# Patient Record
Sex: Male | Born: 1950
Health system: Southern US, Community
[De-identification: ages and names within clinical notes are randomized; demographics above are authoritative.]

## PROBLEM LIST (undated history)

## (undated) DIAGNOSIS — T7840XA Allergy, unspecified, initial encounter: Secondary | ICD-10-CM

## (undated) DIAGNOSIS — R7303 Prediabetes: Secondary | ICD-10-CM

## (undated) DIAGNOSIS — D72819 Decreased white blood cell count, unspecified: Secondary | ICD-10-CM

## (undated) DIAGNOSIS — R001 Bradycardia, unspecified: Secondary | ICD-10-CM

## (undated) DIAGNOSIS — Z789 Other specified health status: Secondary | ICD-10-CM

## (undated) DIAGNOSIS — I639 Cerebral infarction, unspecified: Secondary | ICD-10-CM

## (undated) DIAGNOSIS — J069 Acute upper respiratory infection, unspecified: Secondary | ICD-10-CM

## (undated) DIAGNOSIS — G43909 Migraine, unspecified, not intractable, without status migrainosus: Secondary | ICD-10-CM

## (undated) HISTORY — DX: Other specified health status: Z78.9

## (undated) HISTORY — PX: SHOULDER SURGERY: SHX246

## (undated) HISTORY — DX: Allergy, unspecified, initial encounter: T78.40XA

## (undated) HISTORY — DX: Acute upper respiratory infection, unspecified: J06.9

## (undated) HISTORY — DX: Bradycardia, unspecified: R00.1

## (undated) HISTORY — DX: Migraine, unspecified, not intractable, without status migrainosus: G43.909

## (undated) HISTORY — DX: Cerebral infarction, unspecified: I63.9

## (undated) HISTORY — DX: Decreased white blood cell count, unspecified: D72.819

## (undated) HISTORY — DX: Prediabetes: R73.03

---

## 2001-10-25 ENCOUNTER — Encounter: Payer: Self-pay | Admitting: Family Medicine

## 2001-10-25 ENCOUNTER — Encounter: Admission: RE | Admit: 2001-10-25 | Discharge: 2001-10-25 | Payer: Self-pay | Admitting: Family Medicine

## 2003-05-09 ENCOUNTER — Encounter: Admission: RE | Admit: 2003-05-09 | Discharge: 2003-05-09 | Payer: Self-pay | Admitting: Family Medicine

## 2003-12-28 ENCOUNTER — Ambulatory Visit (HOSPITAL_COMMUNITY): Admission: RE | Admit: 2003-12-28 | Discharge: 2003-12-28 | Payer: Self-pay | Admitting: Gastroenterology

## 2006-03-04 ENCOUNTER — Emergency Department (HOSPITAL_COMMUNITY): Admission: EM | Admit: 2006-03-04 | Discharge: 2006-03-04 | Payer: Self-pay | Admitting: Emergency Medicine

## 2006-06-15 ENCOUNTER — Ambulatory Visit: Payer: Self-pay | Admitting: Oncology

## 2006-07-22 LAB — CBC WITH DIFFERENTIAL/PLATELET
Basophils Absolute: 0 10*3/uL (ref 0.0–0.1)
EOS%: 2.2 % (ref 0.0–7.0)
HCT: 36.8 % — ABNORMAL LOW (ref 38.7–49.9)
HGB: 12.7 g/dL — ABNORMAL LOW (ref 13.0–17.1)
MCH: 30.8 pg (ref 28.0–33.4)
MCHC: 34.5 g/dL (ref 32.0–35.9)
MCV: 89.1 fL (ref 81.6–98.0)
MONO%: 9.7 % (ref 0.0–13.0)
NEUT%: 37.6 % — ABNORMAL LOW (ref 40.0–75.0)
RDW: 12.2 % (ref 11.2–14.6)

## 2006-07-23 LAB — RHEUMATOID FACTOR: Rhuematoid fact SerPl-aCnc: <20 IU/mL (ref 0–20)

## 2006-07-23 LAB — COMPREHENSIVE METABOLIC PANEL
Alkaline Phosphatase: 67 U/L (ref 39–117)
BUN: 21 mg/dL (ref 6–23)
Creatinine, Ser: 1.27 mg/dL (ref 0.40–1.50)
Glucose, Bld: 89 mg/dL (ref 70–99)
Total Bilirubin: 0.6 mg/dL (ref 0.3–1.2)

## 2006-07-23 LAB — ANA: Anti Nuclear Antibody(ANA): NEGATIVE

## 2010-03-08 ENCOUNTER — Encounter: Payer: Self-pay | Admitting: Family Medicine

## 2010-07-09 ENCOUNTER — Other Ambulatory Visit: Payer: Self-pay | Admitting: Family Medicine

## 2010-07-10 ENCOUNTER — Ambulatory Visit
Admission: RE | Admit: 2010-07-10 | Discharge: 2010-07-10 | Disposition: A | Discharge status: Home / Self Care | Payer: BC Managed Care – PPO | Source: Ambulatory Visit | Attending: Family Medicine | Admitting: Family Medicine

## 2010-08-05 ENCOUNTER — Encounter: Payer: Self-pay | Admitting: Family Medicine

## 2010-08-05 DIAGNOSIS — D72819 Decreased white blood cell count, unspecified: Secondary | ICD-10-CM | POA: Insufficient documentation

## 2010-08-05 DIAGNOSIS — G43909 Migraine, unspecified, not intractable, without status migrainosus: Secondary | ICD-10-CM | POA: Insufficient documentation

## 2012-01-10 ENCOUNTER — Emergency Department (HOSPITAL_COMMUNITY)
Admission: EM | Admit: 2012-01-10 | Discharge: 2012-01-10 | Discharge status: Against Medical Advice | Payer: BC Managed Care – PPO | Attending: Emergency Medicine | Admitting: Emergency Medicine

## 2012-01-10 ENCOUNTER — Encounter (HOSPITAL_COMMUNITY): Payer: Self-pay | Admitting: *Deleted

## 2012-01-10 DIAGNOSIS — Y9389 Activity, other specified: Secondary | ICD-10-CM | POA: Insufficient documentation

## 2012-01-10 DIAGNOSIS — Y92410 Unspecified street and highway as the place of occurrence of the external cause: Secondary | ICD-10-CM | POA: Insufficient documentation

## 2012-01-10 DIAGNOSIS — R51 Headache: Secondary | ICD-10-CM | POA: Insufficient documentation

## 2012-01-10 DIAGNOSIS — IMO0002 Reserved for concepts with insufficient information to code with codable children: Secondary | ICD-10-CM | POA: Insufficient documentation
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
## 2012-01-10 NOTE — ED Notes (Signed)
Patient to room T9 - Patient went to Peds for family member.  Instructed to return to room when he can be seen by MD

## 2012-01-10 NOTE — ED Notes (Signed)
Pt has to go to OR with niece and states he will come back later for evaluation

## 2012-05-07 ENCOUNTER — Emergency Department (HOSPITAL_COMMUNITY)
Admission: EM | Admit: 2012-05-07 | Discharge: 2012-05-07 | Disposition: A | Discharge status: Home / Self Care | Payer: BC Managed Care – PPO | Source: Home / Self Care | Attending: Family Medicine | Admitting: Family Medicine

## 2012-05-07 ENCOUNTER — Encounter (HOSPITAL_COMMUNITY): Payer: Self-pay | Admitting: Emergency Medicine

## 2012-05-07 DIAGNOSIS — K529 Noninfective gastroenteritis and colitis, unspecified: Secondary | ICD-10-CM

## 2012-05-07 DIAGNOSIS — K5289 Other specified noninfective gastroenteritis and colitis: Secondary | ICD-10-CM

## 2012-05-07 MED ORDER — ONDANSETRON HCL 4 MG PO TABS: 4.0000 mg | ORAL_TABLET | Freq: Four times a day (QID) | ORAL | Status: DC

## 2012-05-07 NOTE — ED Notes (Signed)
[id number]<AV> Expected date:05/07/12<BR> Expected time:12:48 PM<BR> Means of arrival:<BR> Comments:<BR> Sprayed room with nix

## 2012-05-07 NOTE — ED Provider Notes (Signed)
History     CSN: [id number]  Arrival date & time 05/07/12  1153   First MD Initiated Contact with Patient 05/07/12 1229      Chief Complaint  Patient presents with  . URI    (Consider location/radiation/quality/duration/timing/severity/associated sxs/prior treatment) Patient is a 62 y.o. male presenting with vomiting. The history is provided by the patient.  Emesis Severity:  Mild Duration:  12 hours Quality:  Stomach contents Able to tolerate:  Liquids Progression:  Resolved Chronicity:  New Recent urination:  Normal Associated symptoms: diarrhea   Associated symptoms: no abdominal pain and no fever   Risk factors: no sick contacts     Past Medical History  Diagnosis Date  . Migraine headache   . Allergy   . Leukopenia     Past Surgical History  Procedure Laterality Date  . Shoulder surgery      right    History reviewed. No pertinent family history.  History  Substance Use Topics  . Smoking status: Never Smoker   . Smokeless tobacco: Not on file  . Alcohol Use: No      Review of Systems  Constitutional: Negative.   HENT: Negative.   Gastrointestinal: Positive for nausea, vomiting and diarrhea. Negative for abdominal pain, anal bleeding and rectal pain.    Allergies  Codeine  Home Medications   Current Outpatient Rx  Name  Route  Sig  Dispense  Refill  . ondansetron (ZOFRAN) 4 MG tablet   Oral   Take 1 tablet (4 mg total) by mouth every 6 (six) hours. Prn n/v   6 tablet   0     BP 120/81  Pulse 51  Temp(Src) 97.8 F (36.6 C) (Oral)  SpO2 100%  Physical Exam  Nursing note and vitals reviewed. Constitutional: He is oriented to person, place, and time. He appears well-developed and well-nourished. No distress.  Abdominal: Soft. Bowel sounds are normal. He exhibits no distension and no mass. There is no tenderness. There is no rebound and no guarding.  Neurological: He is alert and oriented to person, place, and time.  Skin: Skin is  warm and dry.    ED Course  Procedures (including critical care time)  Labs Reviewed - No data to display No results found.   1. Gastroenteritis, acute       MDM          Linna Hoff, MD 05/07/12 1344

## 2012-05-07 NOTE — ED Notes (Signed)
No answer in waiting roomx1

## 2012-05-07 NOTE — ED Notes (Addendum)
Pt c/o nausea, vomitting, and diarrhea last night. Episode lasted about one hour then subsided. Loose, watery, stools. Patient denies bloody stools. No fever. Pt did experience night sweats. Has acute flank pain. Patient is alert and oriented.

## 2012-07-22 ENCOUNTER — Ambulatory Visit: Payer: Self-pay | Admitting: Family Medicine

## 2012-07-29 ENCOUNTER — Ambulatory Visit (INDEPENDENT_AMBULATORY_CARE_PROVIDER_SITE_OTHER): Payer: BC Managed Care – PPO | Admitting: *Deleted

## 2012-07-29 DIAGNOSIS — Z2911 Encounter for prophylactic immunotherapy for respiratory syncytial virus (RSV): Secondary | ICD-10-CM

## 2012-07-29 DIAGNOSIS — Z23 Encounter for immunization: Secondary | ICD-10-CM

## 2012-11-08 ENCOUNTER — Encounter: Payer: Self-pay | Admitting: Family Medicine

## 2012-11-08 ENCOUNTER — Ambulatory Visit (INDEPENDENT_AMBULATORY_CARE_PROVIDER_SITE_OTHER): Payer: BC Managed Care – PPO | Admitting: Family Medicine

## 2012-11-08 VITALS — BP 138/78 | HR 80 | Temp 97.5°F | Resp 16 | Ht 68.0 in | Wt 183.0 lb

## 2012-11-08 DIAGNOSIS — J069 Acute upper respiratory infection, unspecified: Secondary | ICD-10-CM

## 2012-11-08 HISTORY — DX: Acute upper respiratory infection, unspecified: J06.9

## 2012-11-08 MED ORDER — AZITHROMYCIN 250 MG PO TABS: ORAL_TABLET | ORAL | Status: DC

## 2012-11-08 NOTE — Patient Instructions (Signed)
Take antibiotics as prescribed Pick up mucinex DM- take 1 tablet twice a day  Plenty of fluids

## 2012-11-08 NOTE — Assessment & Plan Note (Addendum)
Worsening URI, no fever currently, no other co-morbidites Normal WOB in office. Cough with production worse symptom Mucinex DM, z pak given to cover as worsening

## 2012-11-08 NOTE — Progress Notes (Signed)
  Subjective:    Patient ID: Cody [name], male    DOB: 1950-11-16, 62 y.o.   MRN: [id number]  HPI Pt here with worsening cough with some production, fever, and headache for the past 5 days. Has taken OTC alkaseltzer with some improvement in symptoms. One day felt a little SOB, denies CP, N/V, diaphoresis. No current medications.    Review of Systems - per above  GEN- + fatigue,+ fever, weight loss,weakness, recent illness HEENT- denies eye drainage, change in vision, nasal discharge, CVS- denies chest pain, palpitations RESP- denies SOB, +cough, wheeze MSK- denies joint pain, muscle aches, injury Neuro- + headache, dizziness, syncope, seizure activity       Objective:   Physical Exam GEN- NAD, alert and oriented x3 HEENT- PERRL, EOMI, non injected sclera, pink conjunctiva, MMM, oropharynx mild injection, TM clear bilat no effusion, no maxillary sinus tenderness, nares clear Neck- Supple, shotty LAD CVS- RRR, no murmur RESP-CTAB, upper airway congestion EXT- No edema Pulses- Radial 2+         Assessment & Plan:

## 2012-12-23 LAB — HM COLONOSCOPY: HM COLON: NORMAL

## 2013-02-27 ENCOUNTER — Encounter: Payer: Self-pay | Admitting: Family Medicine

## 2013-02-27 ENCOUNTER — Ambulatory Visit (INDEPENDENT_AMBULATORY_CARE_PROVIDER_SITE_OTHER): Payer: BC Managed Care – PPO | Admitting: Family Medicine

## 2013-02-27 VITALS — BP 116/86 | HR 52 | Temp 97.6°F | Resp 18 | Wt 183.0 lb

## 2013-02-27 DIAGNOSIS — R55 Syncope and collapse: Secondary | ICD-10-CM

## 2013-02-27 DIAGNOSIS — R002 Palpitations: Secondary | ICD-10-CM

## 2013-02-27 NOTE — Progress Notes (Signed)
Subjective:    Patient ID: Cody [name], male    DOB: November 10, 1950, 63 y.o.   MRN: [id number]  HPI  Patient is a very pleasant 63 year old African American male who presents with one-week history of "heart racing."  Prior to one week ago, the patient was doing well with no complaints. Over the last week he has had 3 different episodes were his heart will start to race. He states it lasts seconds to minutes. He feels several palpitations in his chest.  It would then spontaneously stopped. He is down so bad however he became dizzy and somewhat lightheaded. He denies any loss of consciousness. He denies any chest pain or shortness of breath or dyspnea on exertion. He denies any excessive caffeine consumption. He denies any stimulant medication. He denies any illicit drug use. He has no family history of thyroid problems, cardiac arrhythmias, or premature sudden cardiac death. Past Medical History  Diagnosis Date  . Migraine headache   . Allergy   . Leukopenia   . Bradycardia    No current outpatient prescriptions on file prior to visit.   No current facility-administered medications on file prior to visit.   Allergies  Allergen Reactions  . Codeine     Sick feeling   History   Social History  . Marital Status: Married    Spouse Name: N/A    Number of Children: N/A  . Years of Education: N/A   Occupational History  . Not on file.   Social History Main Topics  . Smoking status: Never Smoker   . Smokeless tobacco: Never Used  . Alcohol Use: No  . Drug Use: No  . Sexual Activity: Not on file   Other Topics Concern  . Not on file   Social History Narrative  . No narrative on file     Review of Systems  All other systems reviewed and are negative.       Objective:   Physical Exam  Vitals reviewed. Constitutional: He is oriented to person, place, and time. He appears well-developed and well-nourished.  Eyes: Conjunctivae and EOM are normal. Pupils are equal,  round, and reactive to light. No scleral icterus.  Neck: Neck supple. No JVD present. No thyromegaly present.  Cardiovascular: Normal rate, regular rhythm and normal heart sounds.  Exam reveals no gallop and no friction rub.   No murmur heard. Pulmonary/Chest: Effort normal and breath sounds normal. No respiratory distress. He has no wheezes. He has no rales. He exhibits no tenderness.  Abdominal: Soft. Bowel sounds are normal. He exhibits no distension. There is no tenderness. There is no rebound and no guarding.  Lymphadenopathy:    He has no cervical adenopathy.  Neurological: He is alert and oriented to person, place, and time. He has normal reflexes. No cranial nerve deficit. Coordination normal.          Assessment & Plan:  1. Palpitations - CBC with Differential - COMPLETE METABOLIC PANEL WITH GFR - TSH - EKG 12-Lead - Holter monitor - 48 hour; Future  2. Near syncope - Holter monitor - 48 hour; Future   I will obtain a CBC, CMP, TSH. EKG today in the office shows sinus bradycardia 51 beats per minute with normal intervals and normal axis and no evidence of ischemia or infarction. The patient does have early repolarization on EKG.  I will also obtain a Holter monitor. My anticipation is that the patient is having PVCs and PACs. I will confirm this with a  Holter monitor. We need to rule out significant cardiac arrhythmias.

## 2013-02-28 LAB — COMPLETE METABOLIC PANEL WITH GFR
ALT: 12 U/L (ref 0–53)
AST: 15 U/L (ref 0–37)
Albumin: 4.1 g/dL (ref 3.5–5.2)
Alkaline Phosphatase: 61 U/L (ref 39–117)
BILIRUBIN TOTAL: 0.6 mg/dL (ref 0.3–1.2)
BUN: 22 mg/dL (ref 6–23)
CALCIUM: 9.2 mg/dL (ref 8.4–10.5)
CHLORIDE: 103 meq/L (ref 96–112)
CO2: 26 meq/L (ref 19–32)
CREATININE: 1.31 mg/dL (ref 0.50–1.35)
GFR, EST AFRICAN AMERICAN: 68 mL/min
GFR, Est Non African American: 59 mL/min — ABNORMAL LOW
Glucose, Bld: 76 mg/dL (ref 70–99)
Potassium: 4.5 mEq/L (ref 3.5–5.3)
Sodium: 139 mEq/L (ref 135–145)
Total Protein: 6.7 g/dL (ref 6.0–8.3)

## 2013-02-28 LAB — CBC WITH DIFFERENTIAL/PLATELET
BASOS ABS: 0 10*3/uL (ref 0.0–0.1)
Basophils Relative: 1 % (ref 0–1)
EOS ABS: 0.1 10*3/uL (ref 0.0–0.7)
EOS PCT: 2 % (ref 0–5)
HEMATOCRIT: 37.1 % — AB (ref 39.0–52.0)
Hemoglobin: 12.5 g/dL — ABNORMAL LOW (ref 13.0–17.0)
LYMPHS PCT: 61 % — AB (ref 12–46)
Lymphs Abs: 2.1 10*3/uL (ref 0.7–4.0)
MCH: 30 pg (ref 26.0–34.0)
MCHC: 33.7 g/dL (ref 30.0–36.0)
MCV: 89.2 fL (ref 78.0–100.0)
MONO ABS: 0.3 10*3/uL (ref 0.1–1.0)
Monocytes Relative: 9 % (ref 3–12)
Neutro Abs: 1 10*3/uL — ABNORMAL LOW (ref 1.7–7.7)
Neutrophils Relative %: 27 % — ABNORMAL LOW (ref 43–77)
PLATELETS: 200 10*3/uL (ref 150–400)
RBC: 4.16 MIL/uL — ABNORMAL LOW (ref 4.22–5.81)
RDW: 13.3 % (ref 11.5–15.5)
WBC: 3.4 10*3/uL — ABNORMAL LOW (ref 4.0–10.5)

## 2013-02-28 LAB — TSH: TSH: 1.01 u[IU]/mL (ref 0.350–4.500)

## 2013-03-02 ENCOUNTER — Encounter: Payer: Self-pay | Admitting: Podiatrist

## 2013-03-02 ENCOUNTER — Ambulatory Visit (INDEPENDENT_AMBULATORY_CARE_PROVIDER_SITE_OTHER): Payer: BC Managed Care – PPO | Admitting: Podiatrist

## 2013-03-02 ENCOUNTER — Other Ambulatory Visit: Payer: Self-pay | Admitting: Family Medicine

## 2013-03-02 VITALS — BP 122/70 | HR 65 | Resp 12

## 2013-03-02 DIAGNOSIS — M722 Plantar fascial fibromatosis: Secondary | ICD-10-CM

## 2013-03-02 MED ORDER — TRIAMCINOLONE ACETONIDE 40 MG/ML IJ SUSP
20.0000 mg | Freq: Once | INTRAMUSCULAR | Status: AC
  Administered 2013-03-02: 20 mg

## 2013-03-02 NOTE — Progress Notes (Signed)
Subjective:  Patient presents today for follow up on bilateral heel pain.  In the past the patient has had 2 separate injections in bilateral heels over 6 months ago-  He has done well with the injections in the past but states the foot pain has returned.  He also has pain submet 5 as well.  He already has orthotics which are helpful.  Objective:  Patient's chart is reviewed  Neurovascular status is unchanged with palpable pedal pulses and neurological sensation intact.  Plantar fasciitis symptomatology is elicited bilateral heels.  Pain submet 5 right is also present with a hyperkeratotic lesion noted.    Assessment:  Plantar fasciitis bilateral,  Callus x 1  Plan:  Injected plantar fascia bilaterally with kenalog and marcaine mix under sterile technique.  Hyperkeratotic lesion was debrided and orthotic was padded.  Patient given instructions on stretching exercises and shoe gear and will be seen back prn.

## 2013-03-02 NOTE — Patient Instructions (Signed)

## 2013-03-03 ENCOUNTER — Ambulatory Visit (INDEPENDENT_AMBULATORY_CARE_PROVIDER_SITE_OTHER): Payer: BC Managed Care – PPO | Admitting: Family Medicine

## 2013-03-03 DIAGNOSIS — R002 Palpitations: Secondary | ICD-10-CM

## 2013-03-06 NOTE — Progress Notes (Signed)
Patient ID: Cody [name], male   DOB: 1951-02-08, 63 y.o.   MRN: [id number] Pt had Holter Monitor applied on Friday 03/03/2013.  Pt returns unit today for processing.  No problems noted while wearing monitor.  No event diary returned.  Monitor was ordered by Dr Tanya NonesPickard with diagnosis of Palpitations.

## 2013-03-07 ENCOUNTER — Encounter (HOSPITAL_COMMUNITY): Payer: Self-pay | Admitting: Emergency Medicine

## 2013-03-07 ENCOUNTER — Emergency Department (HOSPITAL_COMMUNITY)
Admission: EM | Admit: 2013-03-07 | Discharge: 2013-03-07 | Disposition: A | Discharge status: Home / Self Care | Payer: BC Managed Care – PPO | Source: Home / Self Care

## 2013-03-07 DIAGNOSIS — R6889 Other general symptoms and signs: Secondary | ICD-10-CM

## 2013-03-07 MED ORDER — ONDANSETRON HCL 4 MG PO TABS: 4.0000 mg | ORAL_TABLET | Freq: Four times a day (QID) | ORAL | Status: DC

## 2013-03-07 MED ORDER — OSELTAMIVIR PHOSPHATE 75 MG PO CAPS: 75.0000 mg | ORAL_CAPSULE | Freq: Two times a day (BID) | ORAL | Status: DC

## 2013-03-07 NOTE — ED Provider Notes (Signed)
CSN: [id number]     Arrival date & time 03/07/13  1139 History   First MD Initiated Contact with Patient 03/07/13 1319     Chief Complaint  Patient presents with  . Generalized Body Aches  . Fever   (Consider location/radiation/quality/duration/timing/severity/associated sxs/prior Treatment) HPI Comments: Patient complains of flulike symptoms that started rather acutely last p.m. States he did receive a flu shot this year.   Past Medical History  Diagnosis Date  . Migraine headache   . Allergy   . Leukopenia   . Bradycardia    Past Surgical History  Procedure Laterality Date  . Shoulder surgery      right   History reviewed. No pertinent family history. History  Substance Use Topics  . Smoking status: Never Smoker   . Smokeless tobacco: Never Used  . Alcohol Use: No    Review of Systems  Constitutional: Positive for fever, chills, activity change and fatigue. Negative for diaphoresis.  HENT: Positive for congestion, postnasal drip and rhinorrhea. Negative for ear pain, facial swelling, sore throat and trouble swallowing.   Eyes: Negative for pain, discharge and redness.  Respiratory: Positive for cough. Negative for chest tightness and shortness of breath.   Cardiovascular: Negative.   Gastrointestinal: Positive for nausea. Negative for vomiting, abdominal pain, diarrhea and constipation.  Genitourinary: Negative.   Musculoskeletal: Negative.  Negative for neck pain and neck stiffness.  Neurological: Negative.     Allergies  Codeine  Home Medications   Current Outpatient Rx  Name  Route  Sig  Dispense  Refill  . ondansetron (ZOFRAN) 4 MG tablet   Oral   Take 1 tablet (4 mg total) by mouth every 6 (six) hours.   12 tablet   0   . oseltamivir (TAMIFLU) 75 MG capsule   Oral   Take 1 capsule (75 mg total) by mouth every 12 (twelve) hours.   10 capsule   0    BP 140/101  Pulse 90  Temp(Src) 98.9 F (37.2 C) (Oral)  Resp 18  SpO2 95% Physical Exam   Nursing note and vitals reviewed. Constitutional: He is oriented to person, place, and time. He appears well-developed and well-nourished. No distress.  HENT:  Mouth/Throat: No oropharyngeal exudate.  Bilateral TMs are normal Oropharynx with minor erythema and clear PND.  Eyes: Conjunctivae and EOM are normal.  Neck: Normal range of motion. Neck supple.  Cardiovascular: Normal rate, regular rhythm and normal heart sounds.   Pulmonary/Chest: Effort normal and breath sounds normal. No respiratory distress. He has no wheezes. He has no rales.  Abdominal: Soft. He exhibits no distension. There is no tenderness.  Musculoskeletal: Normal range of motion. He exhibits no edema.  Lymphadenopathy:    He has no cervical adenopathy.  Neurological: He is alert and oriented to person, place, and time.  Skin: Skin is warm and dry. No rash noted.  Psychiatric: He has a normal mood and affect.    ED Course  Procedures (including critical care time) Labs Review Labs Reviewed - No data to display Imaging Review No results found.    MDM   1. Flu-like symptoms    Alka-Seltzer cold plus nighttime relief medication Ibuprofen every 6 hours when necessary pain Zofran 4 mg every 8 hours when necessary nausea Robitussin-DM for cough Drink plenty of fluids stay well hydrated For worsening, new symptoms, high fever, vomiting inability to hold liquids down seek medical attention promptly.    Hayden Rasmussenavid Brylyn Novakovich, NP 03/07/13 1339  Hayden Rasmussenavid Bright Spielmann, NP 03/07/13  1339 

## 2013-03-07 NOTE — ED Provider Notes (Signed)
Medical screening examination/treatment/procedure(s) were performed by resident physician or non-physician practitioner and as supervising physician I was immediately available for consultation/collaboration.   KINDL,JAMES DOUGLAS MD.   James D Kindl, MD 03/07/13 1409 

## 2013-03-07 NOTE — ED Notes (Signed)
C/o cold symptoms that include fever, headache, cough with mucous at times, congestion, and body aches that started last night. Pain is 10/10. Stated he took OTC medications last night and had two ibuprofen this morning with no relief. Denies any other sx. Written by: Marga MelnickQuaNeisha Jones, SMA

## 2013-03-07 NOTE — Discharge Instructions (Signed)
Cough, Adult  A cough is a reflex that helps clear your throat and airways. It can help heal the body or may be a reaction to an irritated airway. A cough may only last 2 or 3 weeks (acute) or may last more than 8 weeks (chronic).  CAUSES Acute cough:  Viral or bacterial infections. Chronic cough:  Infections.  Allergies.  Asthma.  Post-nasal drip.  Smoking.  Heartburn or acid reflux.  Some medicines.  Chronic lung problems (COPD).  Cancer. SYMPTOMS   Cough.  Fever.  Chest pain.  Increased breathing rate.  High-pitched whistling sound when breathing (wheezing).  Colored mucus that you cough up (sputum). TREATMENT   A bacterial cough may be treated with antibiotic medicine.  A viral cough must run its course and will not respond to antibiotics.  Your caregiver may recommend other treatments if you have a chronic cough. HOME CARE INSTRUCTIONS   Only take over-the-counter or prescription medicines for pain, discomfort, or fever as directed by your caregiver. Use cough suppressants only as directed by your caregiver.  Use a cold steam vaporizer or humidifier in your bedroom or home to help loosen secretions.  Sleep in a semi-upright position if your cough is worse at night.  Rest as needed.  Stop smoking if you smoke. SEEK IMMEDIATE MEDICAL CARE IF:   You have pus in your sputum.  Your cough starts to worsen.  You cannot control your cough with suppressants and are losing sleep.  You begin coughing up blood.  You have difficulty breathing.  You develop pain which is getting worse or is uncontrolled with medicine.  You have a fever. MAKE SURE YOU:   Understand these instructions.  Will watch your condition.  Will get help right away if you are not doing well or get worse. Document Released: 08/01/2010 Document Revised: 04/27/2011 Document Reviewed: 08/01/2010 [facility] Patient Information 2014 [place], [place].  Influenza, Adult Alka-Seltzer  cold plus nighttime medication Robitussin-DM Zofran 4 mg for nausea Ibuprofen for aches and pains Plenty of fluids Influenza ("the flu") is a viral infection of the respiratory tract. It occurs more often in winter months because people spend more time in close contact with one another. Influenza can make you feel very sick. Influenza easily spreads from person to person (contagious). CAUSES  Influenza is caused by a virus that infects the respiratory tract. You can catch the virus by breathing in droplets from an infected person's cough or sneeze. You can also catch the virus by touching something that was recently contaminated with the virus and then touching your mouth, nose, or eyes. SYMPTOMS  Symptoms typically last 4 to 10 days and may include:  Fever.  Chills.  Headache, body aches, and muscle aches.  Sore throat.  Chest discomfort and cough.  Poor appetite.  Weakness or feeling tired.  Dizziness.  Nausea or vomiting. DIAGNOSIS  Diagnosis of influenza is often made based on your history and a physical exam. A nose or throat swab test can be done to confirm the diagnosis. RISKS AND COMPLICATIONS You may be at risk for a more severe case of influenza if you smoke cigarettes, have diabetes, have chronic heart disease (such as heart failure) or lung disease (such as asthma), or if you have a weakened immune system. Elderly people and pregnant women are also at risk for more serious infections. The most common complication of influenza is a lung infection (pneumonia). Sometimes, this complication can require emergency medical care and may be life-threatening. PREVENTION  An annual influenza vaccination (flu shot) is the best way to avoid getting influenza. An annual flu shot is now routinely recommended for all adults in the [place] TREATMENT  In mild cases, influenza goes away on its own. Treatment is directed at relieving symptoms. For more severe cases, your caregiver may  prescribe antiviral medicines to shorten the sickness. Antibiotic medicines are not effective, because the infection is caused by a virus, not by bacteria. HOME CARE INSTRUCTIONS  Only take over-the-counter or prescription medicines for pain, discomfort, or fever as directed by your caregiver.  Use a cool mist humidifier to make breathing easier.  Get plenty of rest until your temperature returns to normal. This usually takes 3 to 4 days.  Drink enough fluids to keep your urine clear or pale yellow.  Cover your mouth and nose when coughing or sneezing, and wash your hands well to avoid spreading the virus.  Stay home from work or school until your fever has been gone for at least 1 full day. SEEK MEDICAL CARE IF:   You have chest pain or a deep cough that worsens or produces more mucus.  You have nausea, vomiting, or diarrhea. SEEK IMMEDIATE MEDICAL CARE IF:   You have difficulty breathing, shortness of breath, or your skin or nails turn bluish.  You have severe neck pain or stiffness.  You have a severe headache, facial pain, or earache.  You have a worsening or recurring fever.  You have nausea or vomiting that cannot be controlled. MAKE SURE YOU:  Understand these instructions.  Will watch your condition.  Will get help right away if you are not doing well or get worse. Document Released: 01/31/2000 Document Revised: 08/04/2011 Document Reviewed: 05/04/2011 [facility] Patient Information 2014 [place], [place].  Upper Respiratory Infection, Adult An upper respiratory infection (URI) is also sometimes known as the common cold. The upper respiratory tract includes the nose, sinuses, throat, trachea, and bronchi. Bronchi are the airways leading to the lungs. Most people improve within 1 week, but symptoms can last up to 2 weeks. A residual cough may last even longer.  CAUSES Many different viruses can infect the tissues lining the upper respiratory tract. The tissues become  irritated and inflamed and often become very moist. Mucus production is also common. A cold is contagious. You can easily spread the virus to others by oral contact. This includes kissing, sharing a glass, coughing, or sneezing. Touching your mouth or nose and then touching a surface, which is then touched by another person, can also spread the virus. SYMPTOMS  Symptoms typically develop 1 to 3 days after you come in contact with a cold virus. Symptoms vary from person to person. They may include:  Runny nose.  Sneezing.  Nasal congestion.  Sinus irritation.  Sore throat.  Loss of voice (laryngitis).  Cough.  Fatigue.  Muscle aches.  Loss of appetite.  Headache.  Low-grade fever. DIAGNOSIS  You might diagnose your own cold based on familiar symptoms, since most people get a cold 2 to 3 times a year. Your caregiver can confirm this based on your exam. Most importantly, your caregiver can check that your symptoms are not due to another disease such as strep throat, sinusitis, pneumonia, asthma, or epiglottitis. Blood tests, throat tests, and X-rays are not necessary to diagnose a common cold, but they may sometimes be helpful in excluding other more serious diseases. Your caregiver will decide if any further tests are required. RISKS AND COMPLICATIONS  You may be at  risk for a more severe case of the common cold if you smoke cigarettes, have chronic heart disease (such as heart failure) or lung disease (such as asthma), or if you have a weakened immune system. The very young and very old are also at risk for more serious infections. Bacterial sinusitis, middle ear infections, and bacterial pneumonia can complicate the common cold. The common cold can worsen asthma and chronic obstructive pulmonary disease (COPD). Sometimes, these complications can require emergency medical care and may be life-threatening. PREVENTION  The best way to protect against getting a cold is to practice good  hygiene. Avoid oral or hand contact with people with cold symptoms. Wash your hands often if contact occurs. There is no clear evidence that vitamin C, vitamin E, echinacea, or exercise reduces the chance of developing a cold. However, it is always recommended to get plenty of rest and practice good nutrition. TREATMENT  Treatment is directed at relieving symptoms. There is no cure. Antibiotics are not effective, because the infection is caused by a virus, not by bacteria. Treatment may include:  Increased fluid intake. Sports drinks offer valuable electrolytes, sugars, and fluids.  Breathing heated mist or steam (vaporizer or shower).  Eating chicken soup or other clear broths, and maintaining good nutrition.  Getting plenty of rest.  Using gargles or lozenges for comfort.  Controlling fevers with ibuprofen or acetaminophen as directed by your caregiver.  Increasing usage of your inhaler if you have asthma. Zinc gel and zinc lozenges, taken in the first 24 hours of the common cold, can shorten the duration and lessen the severity of symptoms. Pain medicines may help with fever, muscle aches, and throat pain. A variety of non-prescription medicines are available to treat congestion and runny nose. Your caregiver can make recommendations and may suggest nasal or lung inhalers for other symptoms.  HOME CARE INSTRUCTIONS   Only take over-the-counter or prescription medicines for pain, discomfort, or fever as directed by your caregiver.  Use a warm mist humidifier or inhale steam from a shower to increase air moisture. This may keep secretions moist and make it easier to breathe.  Drink enough water and fluids to keep your urine clear or pale yellow.  Rest as needed.  Return to work when your temperature has returned to normal or as your caregiver advises. You may need to stay home longer to avoid infecting others. You can also use a face mask and careful hand washing to prevent spread of the  virus. SEEK MEDICAL CARE IF:   After the first few days, you feel you are getting worse rather than better.  You need your caregiver's advice about medicines to control symptoms.  You develop chills, worsening shortness of breath, or brown or red sputum. These may be signs of pneumonia.  You develop yellow or brown nasal discharge or pain in the face, especially when you bend forward. These may be signs of sinusitis.  You develop a fever, swollen neck glands, pain with swallowing, or white areas in the back of your throat. These may be signs of strep throat. SEEK IMMEDIATE MEDICAL CARE IF:   You have a fever.  You develop severe or persistent headache, ear pain, sinus pain, or chest pain.  You develop wheezing, a prolonged cough, cough up blood, or have a change in your usual mucus (if you have chronic lung disease).  You develop sore muscles or a stiff neck. Document Released: 07/29/2000 Document Revised: 04/27/2011 Document Reviewed: 06/06/2010 [employer] Patient Information 2014  [employer]  Viral Infections A viral infection can be caused by different types of viruses.Most viral infections are not serious and resolve on their own. However, some infections may cause severe symptoms and may lead to further complications. SYMPTOMS Viruses can frequently cause:  Minor sore throat.  Aches and pains.  Headaches.  Runny nose.  Different types of rashes.  Watery eyes.  Tiredness.  Cough.  Loss of appetite.  Gastrointestinal infections, resulting in nausea, vomiting, and diarrhea. These symptoms do not respond to antibiotics because the infection is not caused by bacteria. However, you might catch a bacterial infection following the viral infection. This is sometimes called a "superinfection." Symptoms of such a bacterial infection may include:  Worsening sore throat with pus and difficulty swallowing.  Swollen neck glands.  Chills and a high or persistent  fever.  Severe headache.  Tenderness over the sinuses.  Persistent overall ill feeling (malaise), muscle aches, and tiredness (fatigue).  Persistent cough.  Yellow, green, or brown mucus production with coughing. HOME CARE INSTRUCTIONS   Only take over-the-counter or prescription medicines for pain, discomfort, diarrhea, or fever as directed by your caregiver.  Drink enough water and fluids to keep your urine clear or pale yellow. Sports drinks can provide valuable electrolytes, sugars, and hydration.  Get plenty of rest and maintain proper nutrition. Soups and broths with crackers or rice are fine. SEEK IMMEDIATE MEDICAL CARE IF:   You have severe headaches, shortness of breath, chest pain, neck pain, or an unusual rash.  You have uncontrolled vomiting, diarrhea, or you are unable to keep down fluids.  You or your child has an oral temperature above 102 F (38.9 C), not controlled by medicine.  Your baby is older than 3 months with a rectal temperature of 102 F (38.9 C) or higher.  Your baby is 723 months old or younger with a rectal temperature of 100.4 F (38 C) or higher. MAKE SURE YOU:   Understand these instructions.  Will watch your condition.  Will get help right away if you are not doing well or get worse. Document Released: 11/12/2004 Document Revised: 04/27/2011 Document Reviewed: 06/09/2010 [facility] Patient Information 2014 [place]

## 2013-04-03 ENCOUNTER — Encounter: Payer: Self-pay | Admitting: Family Medicine

## 2013-07-01 ENCOUNTER — Emergency Department (HOSPITAL_COMMUNITY)
Admission: EM | Admit: 2013-07-01 | Discharge: 2013-07-01 | Disposition: A | Discharge status: Home / Self Care | Payer: BC Managed Care – PPO | Source: Home / Self Care | Attending: Emergency Medicine | Admitting: Emergency Medicine

## 2013-07-01 ENCOUNTER — Encounter (HOSPITAL_COMMUNITY): Payer: Self-pay | Admitting: Emergency Medicine

## 2013-07-01 DIAGNOSIS — M545 Low back pain, unspecified: Secondary | ICD-10-CM

## 2013-07-01 MED ORDER — KETOROLAC TROMETHAMINE 60 MG/2ML IM SOLN
INTRAMUSCULAR | Status: AC
  Filled 2013-07-01: qty 2

## 2013-07-01 MED ORDER — HYDROCODONE-ACETAMINOPHEN 5-325 MG PO TABS
ORAL_TABLET | ORAL | Status: AC
  Filled 2013-07-01: qty 2

## 2013-07-01 MED ORDER — DICLOFENAC SODIUM 75 MG PO TBEC: 75.0000 mg | DELAYED_RELEASE_TABLET | Freq: Two times a day (BID) | ORAL | Status: DC

## 2013-07-01 MED ORDER — HYDROCODONE-ACETAMINOPHEN 5-325 MG PO TABS: ORAL_TABLET | ORAL | Status: DC

## 2013-07-01 MED ORDER — KETOROLAC TROMETHAMINE 60 MG/2ML IM SOLN
60.0000 mg | Freq: Once | INTRAMUSCULAR | Status: AC
  Administered 2013-07-01: 60 mg via INTRAMUSCULAR

## 2013-07-01 MED ORDER — METHYLPREDNISOLONE ACETATE 80 MG/ML IJ SUSP
80.0000 mg | Freq: Once | INTRAMUSCULAR | Status: AC
  Administered 2013-07-01: 80 mg via INTRAMUSCULAR

## 2013-07-01 MED ORDER — CYCLOBENZAPRINE HCL 5 MG PO TABS: 5.0000 mg | ORAL_TABLET | Freq: Three times a day (TID) | ORAL | Status: DC | PRN

## 2013-07-01 MED ORDER — METHYLPREDNISOLONE ACETATE 80 MG/ML IJ SUSP
INTRAMUSCULAR | Status: AC
  Filled 2013-07-01: qty 1

## 2013-07-01 MED ORDER — HYDROCODONE-ACETAMINOPHEN 5-325 MG PO TABS
2.0000 | ORAL_TABLET | Freq: Once | ORAL | Status: AC
  Administered 2013-07-01: 2 via ORAL

## 2013-07-01 NOTE — ED Notes (Signed)
Pt c/o lower back pain x 2 days. Reports he was bending over to pick something up and felt a pain. ? Pulled muscle. Has been taking aleeve with no relief. Patient is alert and oriented and in no acute distress.

## 2013-07-01 NOTE — ED Provider Notes (Signed)
Chief Complaint   Chief Complaint  Patient presents with  . Back Pain    History of Present Illness   Elsie Saasnthony E Pippins is a 63 year old male who has a three-day history of lower back pain. He denies any injury. He was sitting on the couch, hunched over a laptop, when he got up and experienced sudden ounces of pain and muscle spasm. He did not fall to his knees. The pain does not radiate into the legs. It's confined to the back. No numbness, tingling, weakness in the legs. No bladder or bowel dysfunction or saddle anesthesia. He denies any abdominal pain, fever, chills, or unintentional weight loss. The pain is worse with any kind of movement. He comes in today in a wheelchair and has a great deal of difficulty even just standing up.  Review of Systems   Other than as noted above, the patient denies any of the following symptoms: Systemic:  No fever, chills, or unexplained weight loss. GI:  No abdominal painor incontinence of bowel. GU:  No dysuria, frequency, urgency, or hematuria. No incontinence of urine or urinary retention.  M-S:  No neck pain or arthritis. Neuro:  No paresthesias, headache, saddle anesthesia, muscular weakness, or progressive neurological deficit.  PMFSH   Past medical history, family history, social history, meds, and allergies were reviewed. Specifically, there is no history of cancer, major trauma, osteoporosis, immunosuppression, or HIV infection.   Physical Examination    Vital signs:  BP 141/88  Pulse 56  Temp(Src) 98.4 F (36.9 C) (Oral)  Resp 18  SpO2 100% General:  Alert, oriented, in moderate distress due to pain. He is in a wheelchair, has a good deal difficulty getting in and out of the wheelchair. Abdomen:  Soft, non-tender.  No organomegaly or mass.  No pulsatile midline abdominal mass or bruit. Back:  There is tenderness to palpation lower lumbar spine. His back has almost 0 range of motion with pain and muscle spasm on any kind of back  movement. The leg raising produces lower back pain bilaterally. Neuro:  Normal muscle strength, sensations and DTRs. Extremities: Pedal pulses were full, there was no edema. Skin:  Clear, warm and dry.  No rash.   Course in Urgent Care Center   He was given Depo-Medrol 80 mg IM, Toradol 60 mg IM, and Norco 5/325 2 by mouth for pain.    Assessment   The encounter diagnosis was Lumbago.  No evidence of cauda equina syndrome.  Plan     1.  Meds:  The following meds were prescribed:   Discharge Medication List as of 07/01/2013  3:34 PM    START taking these medications   Details  cyclobenzaprine (FLEXERIL) 5 MG tablet Take 1 tablet (5 mg total) by mouth 3 (three) times daily as needed for muscle spasms., Starting 07/01/2013, Until Discontinued, Normal    diclofenac (VOLTAREN) 75 MG EC tablet Take 1 tablet (75 mg total) by mouth 2 (two) times daily., Starting 07/01/2013, Until Discontinued, Normal    HYDROcodone-acetaminophen (NORCO/VICODIN) 5-325 MG per tablet 1 to 2 tabs every 4 to 6 hours as needed for pain., Print        2.  Patient Education/Counseling:  The patient was given appropriate handouts, self care instructions, and instructed in symptomatic relief. The patient was encouraged to try to be as active as possible and given some exercises to do followed by moist heat.  3.  Follow up:  The patient was told to follow up here if no  better in 3 to 4 days, or sooner if becoming worse in any way, and given some red flag symptoms such as worsening pain or new neurological symptoms which would prompt immediate return.  Follow up with Dr. August Saucerean if no improvement at the end of 2-3 weeks.     Reuben Likesavid C Kassi Esteve, MD 07/01/13 [phone]

## 2013-07-01 NOTE — Discharge Instructions (Signed)
Do exercises twice daily followed by moist heat for 15 minutes. ° ° ° ° ° °Try to be as active as possible. ° °If no better in 2 weeks, follow up with orthopedist. ° ° °

## 2014-06-05 ENCOUNTER — Encounter: Payer: Self-pay | Admitting: Family Medicine

## 2014-08-28 ENCOUNTER — Ambulatory Visit (INDEPENDENT_AMBULATORY_CARE_PROVIDER_SITE_OTHER): Payer: BLUE CROSS/BLUE SHIELD | Admitting: Family Medicine

## 2014-08-28 ENCOUNTER — Encounter: Payer: Self-pay | Admitting: Family Medicine

## 2014-08-28 VITALS — BP 110/78 | HR 74 | Temp 98.2°F | Resp 18 | Wt 185.0 lb

## 2014-08-28 DIAGNOSIS — R413 Other amnesia: Secondary | ICD-10-CM

## 2014-08-28 NOTE — Progress Notes (Signed)
Subjective:    Patient ID: Cody [name], male    DOB: Jan 01, 1951, 64 y.o.   MRN: [id number]  HPI Patient has not been seen in more than a year. He presents today with worsening memory loss over the last 2 months. He recently has gotten in trouble at work on 2 separate occasions for performing the wrong task. His task are written out for him and he is unable to comprehend the directions and follow them. 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 He also frequently forgets conversations and things that she is told him. Patient states that his coworkers have even stated that he has talked incoherently at times and made little sense. The patient is unable to give me specific examples. His neurologic exam today is completely normal. Cranial nerves II through XII are grossly intact. Muscle strength is 5 over 5 equal and symmetric in the upper and lower extremities. He has normal reflexes. He has a negative Romberg sign. On Mini-Mental status exam he scored 30 out of 30. He is able to spell world backwards. However he has a great difficulty if I asked the patient to perform serial sevens and frequently gets confused. Past Medical History  Diagnosis Date  . Migraine headache   . Allergy   . Leukopenia   . Bradycardia    Past Surgical History  Procedure Laterality Date  . Shoulder surgery      right   No current outpatient prescriptions on file prior to visit.   No current facility-administered medications on file prior to visit.   Allergies  Allergen Reactions  . Codeine     Sick feeling   History   Social History  . Marital Status: Married    Spouse Name: N/A  . Number of Children: N/A  . Years of Education: N/A   Occupational History  . Not on file.   Social History Main Topics  . Smoking status: Never Smoker   . Smokeless tobacco: Never Used  . Alcohol Use: No  . Drug Use: No  . Sexual Activity: Not on file    Other Topics Concern  . Not on file   Social History Narrative   No family history on file.    Review of Systems  All other systems reviewed and are negative.      Objective:   Physical Exam  Constitutional: He is oriented to person, place, and time. He appears well-developed and well-nourished.  Cardiovascular: Normal rate, regular rhythm, normal heart sounds and intact distal pulses.   No murmur heard. Pulmonary/Chest: Effort normal and breath sounds normal.  Abdominal: Soft. Bowel sounds are normal.  Neurological: He is alert and oriented to person, place, and time. He has normal reflexes. He displays normal reflexes. No cranial nerve deficit. He exhibits normal muscle tone. Coordination normal.  Vitals reviewed.         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  He also reports chronic daily headaches that seem to be intensifying. Therefore I'm going to schedule the patient for an MRI of the  brain. I will also check a CMP, TSH, and a vitamin B12

## 2014-08-29 LAB — CBC WITH DIFFERENTIAL/PLATELET
Basophils Absolute: 0 10*3/uL (ref 0.0–0.1)
Basophils Relative: 1 % (ref 0–1)
Eosinophils Absolute: 0 10*3/uL (ref 0.0–0.7)
Eosinophils Relative: 1 % (ref 0–5)
HCT: 39.7 % (ref 39.0–52.0)
HEMOGLOBIN: 13 g/dL (ref 13.0–17.0)
LYMPHS PCT: 64 % — AB (ref 12–46)
Lymphs Abs: 2.2 10*3/uL (ref 0.7–4.0)
MCH: 30.3 pg (ref 26.0–34.0)
MCHC: 32.7 g/dL (ref 30.0–36.0)
MCV: 92.5 fL (ref 78.0–100.0)
MONOS PCT: 9 % (ref 3–12)
MPV: 10.8 fL (ref 8.6–12.4)
Monocytes Absolute: 0.3 10*3/uL (ref 0.1–1.0)
NEUTROS ABS: 0.9 10*3/uL — AB (ref 1.7–7.7)
NEUTROS PCT: 25 % — AB (ref 43–77)
PLATELETS: 176 10*3/uL (ref 150–400)
RBC: 4.29 MIL/uL (ref 4.22–5.81)
RDW: 13.6 % (ref 11.5–15.5)
WBC: 3.4 10*3/uL — ABNORMAL LOW (ref 4.0–10.5)

## 2014-08-29 LAB — COMPLETE METABOLIC PANEL WITH GFR
ALT: 15 U/L (ref 0–53)
AST: 17 U/L (ref 0–37)
Albumin: 3.9 g/dL (ref 3.5–5.2)
Alkaline Phosphatase: 66 U/L (ref 39–117)
BILIRUBIN TOTAL: 0.6 mg/dL (ref 0.2–1.2)
BUN: 20 mg/dL (ref 6–23)
CO2: 27 meq/L (ref 19–32)
Calcium: 9.2 mg/dL (ref 8.4–10.5)
Chloride: 105 mEq/L (ref 96–112)
Creat: 1.1 mg/dL (ref 0.50–1.35)
GFR, Est African American: 83 mL/min
GFR, Est Non African American: 72 mL/min
Glucose, Bld: 94 mg/dL (ref 70–99)
Potassium: 4.7 mEq/L (ref 3.5–5.3)
SODIUM: 141 meq/L (ref 135–145)
TOTAL PROTEIN: 7 g/dL (ref 6.0–8.3)

## 2014-08-29 LAB — TSH: TSH: 1.286 u[IU]/mL (ref 0.350–4.500)

## 2014-08-29 LAB — VITAMIN B12: Vitamin B-12: 1408 pg/mL — ABNORMAL HIGH (ref 211–911)

## 2014-09-06 ENCOUNTER — Other Ambulatory Visit: Payer: Self-pay | Admitting: Podiatrist

## 2014-09-11 ENCOUNTER — Ambulatory Visit
Admission: RE | Admit: 2014-09-11 | Discharge: 2014-09-11 | Disposition: A | Discharge status: Home / Self Care | Payer: BLUE CROSS/BLUE SHIELD | Source: Ambulatory Visit | Attending: Family Medicine | Admitting: Family Medicine

## 2014-09-11 DIAGNOSIS — R413 Other amnesia: Secondary | ICD-10-CM

## 2014-09-11 MED ORDER — GADOBENATE DIMEGLUMINE 529 MG/ML IV SOLN
17.0000 mL | Freq: Once | INTRAVENOUS | Status: AC | PRN
  Administered 2014-09-11: 17 mL via INTRAVENOUS

## 2014-09-13 ENCOUNTER — Other Ambulatory Visit: Payer: Self-pay | Admitting: Family Medicine

## 2014-09-13 DIAGNOSIS — I729 Aneurysm of unspecified site: Secondary | ICD-10-CM

## 2014-09-18 ENCOUNTER — Ambulatory Visit
Admission: RE | Admit: 2014-09-18 | Discharge: 2014-09-18 | Disposition: A | Discharge status: Home / Self Care | Payer: BLUE CROSS/BLUE SHIELD | Source: Ambulatory Visit | Attending: Family Medicine | Admitting: Family Medicine

## 2014-09-18 DIAGNOSIS — I729 Aneurysm of unspecified site: Secondary | ICD-10-CM

## 2014-09-27 ENCOUNTER — Encounter: Payer: Self-pay | Admitting: Family Medicine

## 2014-09-27 ENCOUNTER — Ambulatory Visit (INDEPENDENT_AMBULATORY_CARE_PROVIDER_SITE_OTHER): Payer: BLUE CROSS/BLUE SHIELD | Admitting: Family Medicine

## 2014-09-27 VITALS — BP 104/80 | HR 78 | Temp 98.1°F | Resp 16 | Wt 188.0 lb

## 2014-09-27 DIAGNOSIS — R413 Other amnesia: Secondary | ICD-10-CM | POA: Diagnosis not present

## 2014-09-27 DIAGNOSIS — Z8673 Personal history of transient ischemic attack (TIA), and cerebral infarction without residual deficits: Secondary | ICD-10-CM | POA: Diagnosis not present

## 2014-09-27 DIAGNOSIS — I639 Cerebral infarction, unspecified: Secondary | ICD-10-CM | POA: Insufficient documentation

## 2014-09-27 MED ORDER — DONEPEZIL HCL 10 MG PO TABS: 10.0000 mg | ORAL_TABLET | Freq: Every day | ORAL | Status: DC

## 2014-09-27 NOTE — Progress Notes (Signed)
Subjective:    Patient ID: Cody [name], male    DOB: Jun 23, 1950, 64 y.o.   MRN: [id number]  HPI 08/28/14 Patient has not been seen in more than a year. He presents today with worsening memory loss over the last 2 months. He recently has gotten in trouble at work on 2 separate occasions for performing the wrong task. His task are written out for him and he is unable to comprehend the directions and follow them. 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 He also frequently forgets conversations and things that she is told him. Patient states that his coworkers have even stated that he has talked incoherently at times and made little sense. The patient is unable to give me specific examples. His neurologic exam today is completely normal. Cranial nerves II through XII are grossly intact. Muscle strength is 5 over 5 equal and symmetric in the upper and lower extremities. He has normal reflexes. He has a negative Romberg sign. On Mini-Mental status exam he scored 30 out of 30. He is able to spell world backwards. However he has a great difficulty if I asked the patient to perform serial sevens and frequently gets confused. 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  He also reports chronic daily headaches that seem to be intensifying. Therefore I'm going to schedule the patient for an MRI of the brain. I will also check a CMP, TSH, and a vitamin B12  09/27/14 Patient's labs were completely normal. MRI results are listed below: No acute intracranial findings. No features to strongly suggest complicated migraine.  Remote RIGHT anterior caudate periventricular infarct, may have slight susceptibility suggesting prior  hemorrhage.  Cannot exclude a RIGHT internal carotid artery cavernous sinus aneurysm. 6 x 7 mm cross-section. Recommend MRA intracranial for further evaluation.  MRA revealed that to be a spurious finding. There was no aneurysm seen on MRA. Patient is here today to [place] by his wife to discuss the results. She is very concerned about his memory loss. In addition to what he mentioned to me last time, the wife states he occasionally will become disoriented while driving and forget where he is located.  This has even occurred on [place] that he is driven for the last 20 years. In addition she states that he is more forgetful in daily activities including conversations that they're having. Past Medical History  Diagnosis Date  . Migraine headache   . Allergy   . Leukopenia   . Bradycardia   . CVA (cerebral infarction)    Past Surgical History  Procedure Laterality Date  . Shoulder surgery      right   No current outpatient prescriptions on file prior to visit.   No current facility-administered medications on file prior to visit.   Allergies  Allergen Reactions  . Codeine     Sick feeling   Social History   Social History  . Marital Status: Married    Spouse Name: N/A  . Number of Children: N/A  . Years of Education: N/A   Occupational History  . Not on file.   Social History Main Topics  . Smoking status: Never Smoker   .  Smokeless tobacco: Never Used  . Alcohol Use: No  . Drug Use: No  . Sexual Activity: Not on file   Other Topics Concern  . Not on file   Social History Narrative   No family history on file.    Review of Systems  All other systems reviewed and are negative.      Objective:   Physical Exam  Constitutional: He is oriented to person, place, and time. He appears well-developed and well-nourished.  Cardiovascular: Normal rate, regular rhythm, normal heart sounds and intact distal pulses.   No murmur heard. Pulmonary/Chest: Effort normal and  breath sounds normal.  Abdominal: Soft. Bowel sounds are normal.  Neurological: He is alert and oriented to person, place, and time. He has normal reflexes. No cranial nerve deficit. He exhibits normal muscle tone. Coordination normal.  Vitals reviewed.         Assessment & Plan:  Memory loss - Plan: donepezil (ARICEPT) 10 MG tablet  H/O: CVA (cerebrovascular accident)  I believe the patient has early signs of dementia. Therefore we will begin the patient on Aricept 5 mg a day and increase to 10 mg a day in one month. I would like to see him back in 6 months for follow-up. At that time we can see if the patient is deteriorating. If there is evidence of deterioration we may want to consider adding Namenda. If the patient is stable, we may want to temporarily discontinue the medication to see if he truly in fact is benefiting from it. I am concerned by the stroke seen on his MRI. Therefore I'll start the patient on aspirin 81 mg by mouth daily. I've also asked him to return fasting for fasting lipid panel. Goal LDL cholesterol is less than 70.

## 2014-10-25 ENCOUNTER — Telehealth: Payer: Self-pay | Admitting: *Deleted

## 2014-10-25 NOTE — Telephone Encounter (Signed)
Patient returned call and made aware.

## 2014-10-25 NOTE — Telephone Encounter (Signed)
Received patient lab results from [facility].   MD reviewed and states that all labs are normal.   Call placed to patient. LMTRC.

## 2015-04-04 ENCOUNTER — Encounter: Payer: Self-pay | Admitting: Podiatry

## 2015-04-04 ENCOUNTER — Ambulatory Visit (INDEPENDENT_AMBULATORY_CARE_PROVIDER_SITE_OTHER): Payer: BLUE CROSS/BLUE SHIELD | Admitting: Podiatry

## 2015-04-04 VITALS — BP 140/79 | HR 61 | Resp 16

## 2015-04-04 DIAGNOSIS — B351 Tinea unguium: Secondary | ICD-10-CM

## 2015-04-04 MED ORDER — TERBINAFINE HCL 250 MG PO TABS: 250.0000 mg | ORAL_TABLET | Freq: Every day | ORAL | Status: DC

## 2015-04-05 NOTE — Progress Notes (Signed)
Subjective:     Patient ID: Cody [name], male   DOB: [birth year], 65 y.o.   MRN: [id number]  HPI patient states I've had fungus and my nails and it was treated with oral medication 3 or 4 years ago and did well and I would like to repeat medication   Review of Systems     Objective:   Physical Exam Neurovascular status intact no other health history changes with severe thickening of nailbeds bilateral with the hallux third and fifth nails been especially affected    Assessment:     Severe mycotic nail infection bilateral systemic in nature    Plan:     Revealed and reviewed treatment and have recommended oral terbinafine for 90 days and he states he just a liver function studies which were normal and the last few months and he will send me the results. Prescription written I've also recommended we keep him on a pulse treatment starting in approximately year and he will contact us for that

## 2015-04-24 ENCOUNTER — Encounter: Payer: Self-pay | Admitting: Physician Assistant

## 2015-04-24 ENCOUNTER — Encounter: Payer: Self-pay | Admitting: Family Medicine

## 2015-04-24 ENCOUNTER — Ambulatory Visit (INDEPENDENT_AMBULATORY_CARE_PROVIDER_SITE_OTHER): Payer: BLUE CROSS/BLUE SHIELD | Admitting: Physician Assistant

## 2015-04-24 VITALS — BP 142/94 | HR 56 | Temp 98.4°F | Resp 18 | Wt 187.0 lb

## 2015-04-24 DIAGNOSIS — J111 Influenza due to unidentified influenza virus with other respiratory manifestations: Secondary | ICD-10-CM | POA: Diagnosis not present

## 2015-04-24 MED ORDER — OSELTAMIVIR PHOSPHATE 75 MG PO CAPS: 75.0000 mg | ORAL_CAPSULE | Freq: Two times a day (BID) | ORAL | Status: DC

## 2015-04-24 NOTE — Progress Notes (Signed)
    Patient ID: Cody [name] MRN: [id number], DOB: 11-21-1950, 65 y.o. Date of Encounter: 04/24/2015, 12:11 PM    Chief Complaint:  Chief Complaint  Patient presents with  . sick x 2 weeks    but yesterday started to feel weak with chills and body aches     HPI: 65 y.o. year old AA male presents with above.   Says that for the past 2 weeks he has just been having sneezing and clear rhinorrhea which he thinks is just his allergies. States that those symptoms have remained the same and had not been changing or worsening. He was not developing any thick dark mucus.  Says that just yesterday he started to feel weak and chills and body aches. Also just yesterday developed a cough.  No other complaints or concerns.  Has taken TheraFlu this morning.     Home Meds:   Outpatient Prescriptions Prior to Visit  Medication Sig Dispense Refill  . donepezil (ARICEPT) 10 MG tablet Take 1 tablet (10 mg total) by mouth at bedtime. 30 tablet 5  . terbinafine (LAMISIL) 250 MG tablet Take 1 tablet (250 mg total) by mouth daily. 90 tablet 0   No facility-administered medications prior to visit.    Allergies:  Allergies  Allergen Reactions  . Codeine     Sick feeling      Review of Systems: See HPI for pertinent ROS. All other ROS negative.    Physical Exam: Blood pressure 142/94, pulse 56, temperature 98.4 F (36.9 C), temperature source Oral, resp. rate 18, weight 187 lb (84.823 kg)., Body mass index is 28.44 kg/(m^2). General:  WNWD AAM. Appears in no acute distress. HEENT: Normocephalic, atraumatic, eyes without discharge, sclera non-icteric, nares are without discharge. Bilateral auditory canals clear, TM's are without perforation, pearly grey and translucent with reflective cone of light bilaterally. Oral cavity moist, posterior pharynx without exudate, erythema, peritonsillar abscess. No tenderness with percussion to frontal or maxillary sinuses bilaterally.  Neck: Supple.  No thyromegaly. No lymphadenopathy. Lungs: Clear bilaterally to auscultation without wheezes, rales, or rhonchi. Breathing is unlabored. Heart: Regular rhythm. No murmurs, rubs, or gallops. Msk:  Strength and tone normal for age. Extremities/Skin: Warm and dry. Neuro: Alert and oriented X 3. Moves all extremities spontaneously. Gait is normal. CNII-XII grossly in tact. Psych:  Responds to questions appropriately with a normal affect.     ASSESSMENT AND PLAN:  65 y.o. year old male with  1. Influenza He is to start the Tamiflu immediately, take as directed. Can continue over-the-counter medications for symptom relief. Note given for out of work today through Friday.  Return to work Monday. He is to avoid near contact with other household members etc. - oseltamivir (TAMIFLU) 75 MG capsule; Take 1 capsule (75 mg total) by mouth 2 (two) times daily.  Dispense: 10 capsule; Refill: 0   Signed, [address]Marieme Mcmackin Beth [place], [place], [facility] 04/24/2015 12:11 PM

## 2015-05-30 ENCOUNTER — Encounter: Payer: Self-pay | Admitting: Family Medicine

## 2015-05-30 ENCOUNTER — Ambulatory Visit (INDEPENDENT_AMBULATORY_CARE_PROVIDER_SITE_OTHER): Payer: BLUE CROSS/BLUE SHIELD | Admitting: Family Medicine

## 2015-05-30 VITALS — BP 138/70 | HR 72 | Temp 97.9°F | Resp 16 | Ht 68.0 in | Wt 190.0 lb

## 2015-05-30 DIAGNOSIS — Z8673 Personal history of transient ischemic attack (TIA), and cerebral infarction without residual deficits: Secondary | ICD-10-CM

## 2015-05-30 DIAGNOSIS — R413 Other amnesia: Secondary | ICD-10-CM | POA: Diagnosis not present

## 2015-05-30 DIAGNOSIS — Z125 Encounter for screening for malignant neoplasm of prostate: Secondary | ICD-10-CM | POA: Diagnosis not present

## 2015-05-30 MED ORDER — DONEPEZIL HCL 5 MG PO TABS: 5.0000 mg | ORAL_TABLET | Freq: Every day | ORAL | Status: DC

## 2015-05-30 NOTE — Progress Notes (Signed)
Subjective:    Patient ID: Cody [name], male    DOB: 12/02/1950, 65 y.o.   MRN: [id number]  HPI 08/28/14 Patient has not been seen in more than a year. He presents today with worsening memory loss over the last 2 months. He recently has gotten in trouble at work on 2 separate occasions for performing the wrong task. His task are written out for him and he is unable to comprehend the directions and follow them. 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 He also frequently forgets conversations and things that she is told him. Patient states that his coworkers have even stated that he has talked incoherently at times and made little sense. The patient is unable to give me specific examples. His neurologic exam today is completely normal. Cranial nerves II through XII are grossly intact. Muscle strength is 5 over 5 equal and symmetric in the upper and lower extremities. He has normal reflexes. He has a negative Romberg sign. On Mini-Mental status exam he scored 30 out of 30. He is able to spell world backwards. However he has a great difficulty if I asked the patient to perform serial sevens and frequently gets confused. 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  He also reports chronic daily headaches that seem to be intensifying. Therefore I'm going to schedule the patient for an MRI of the brain. I will also check a CMP, TSH, and a vitamin B12  09/27/14 Patient's labs were completely normal. MRI results are listed below: No acute intracranial findings. No features to strongly suggest complicated migraine.  Remote RIGHT anterior caudate periventricular infarct, may have slight susceptibility suggesting prior  hemorrhage.  Cannot exclude a RIGHT internal carotid artery cavernous sinus aneurysm. 6 x 7 mm cross-section. Recommend MRA intracranial for further evaluation.  MRA revealed that to be a spurious finding. There was no aneurysm seen on MRA. Patient is here today accompanied by his wife to discuss the results. She is very concerned about his memory loss. In addition to what he mentioned to me last time, the wife states he occasionally will become disoriented while driving and forget where he is located.  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 In addition she states that he is more forgetful in daily activities including conversations that they're having.  At that time, my plan was: I believe the patient has early signs of dementia. Therefore we will begin the patient on Aricept 5 mg a day and increase to 10 mg a day in one month. I would like to see him back in 6 months for follow-up. At that time we can see if the patient is deteriorating. If there is evidence of deterioration we may want to consider adding Namenda. If the patient is stable, we may want to temporarily discontinue the medication to see if he truly in fact is benefiting from it. I am concerned by the stroke seen on his MRI. Therefore I'll start the patient on aspirin 81 mg by mouth daily. I've also asked him to return fasting for fasting lipid panel. Goal LDL cholesterol is less than 70.  05/30/15 Has  not had the fasting labs I requested.  He states he will be glad to come in Monday fasting for lab work. He is being compliant taking a baby aspirin every day. He tried to increase Aricept to 10 mg but had weird vivid dreams which made him cut the dosage back to 5 mg. He is tolerating the 5 mg without difficulty. He states that he feels better on the 5 mg. He states that he has not had any issues at work. He states that he is not gotten lost driving. His wife has noticed improvement in his memory and in his  concentration.  I am not sure if this is a placebo effect however the patient does seem to be brighter, more responsive, and appropriately answering questions more fluidly without hesitations. Colonoscopy is up-to-date. He declines hepatitis C screening Past Medical History  Diagnosis Date  . Migraine headache   . Allergy   . Leukopenia   . Bradycardia   . CVA (cerebral infarction)    Past Surgical History  Procedure Laterality Date  . Shoulder surgery      right   Current Outpatient Prescriptions on File Prior to Visit  Medication Sig Dispense Refill  . donepezil (ARICEPT) 10 MG tablet Take 1 tablet (10 mg total) by mouth at bedtime. 30 tablet 5  . oseltamivir (TAMIFLU) 75 MG capsule Take 1 capsule (75 mg total) by mouth 2 (two) times daily. 10 capsule 0  . terbinafine (LAMISIL) 250 MG tablet Take 1 tablet (250 mg total) by mouth daily. 90 tablet 0   No current facility-administered medications on file prior to visit.   Allergies  Allergen Reactions  . Codeine     Sick feeling   Social History   Social History  . Marital Status: Married    Spouse Name: N/A  . Number of Children: N/A  . Years of Education: N/A   Occupational History  . Not on file.   Social History Main Topics  . Smoking status: Never Smoker   . Smokeless tobacco: Never Used  . Alcohol Use: No  . Drug Use: No  . Sexual Activity: Not on file   Other Topics Concern  . Not on file   Social History Narrative   No family history on file.    Review of Systems  All other systems reviewed and are negative.      Objective:   Physical Exam  Constitutional: He is oriented to person, place, and time. He appears well-developed and well-nourished.  Cardiovascular: Normal rate, regular rhythm, normal heart sounds and intact distal pulses.   No murmur heard. Pulmonary/Chest: Effort normal and breath sounds normal.  Abdominal: Soft. Bowel sounds are normal.  Neurological: He is alert and oriented to  person, place, and time. He has normal reflexes. No cranial nerve deficit. He exhibits normal muscle tone. Coordination normal.  Vitals reviewed.         Assessment & Plan:  Memory loss - Plan: donepezil (ARICEPT) 5 MG tablet  H/O: CVA (cerebrovascular accident) - Plan: CBC with Differential/Platelet, COMPLETE METABOLIC PANEL WITH GFR, Lipid panel  Prostate cancer screening - Plan: PSA  Overall the patient seems to be doing much better. I question whether the patient has true dementia or whether he was experiencing memory loss possibly due to another underlying problem such as depression, etc. I gave the patient the option of discontinuing the medication to see if he notices a change however the patient would like to continue the medication at  least for the next 6 months which I believe is reasonable. I would have him come in fasting for lab work. At that time we can check him for prostate cancer as well as get a fasting lipid panel. His goal LDL cholesterol is less than 70. Continue aspirin. Colonoscopy is up-to-date. He declines hepatitis C screening

## 2015-06-10 ENCOUNTER — Other Ambulatory Visit: Payer: BLUE CROSS/BLUE SHIELD

## 2015-06-10 LAB — CBC WITH DIFFERENTIAL/PLATELET
BASOS ABS: 0 {cells}/uL (ref 0–200)
Basophils Relative: 0 %
EOS PCT: 2 %
Eosinophils Absolute: 56 cells/uL (ref 15–500)
HCT: 39.6 % (ref 38.5–50.0)
Hemoglobin: 13.2 g/dL (ref 13.0–17.0)
LYMPHS PCT: 63 %
Lymphs Abs: 1764 cells/uL (ref 850–3900)
MCH: 30.8 pg (ref 27.0–33.0)
MCHC: 33.3 g/dL (ref 32.0–36.0)
MCV: 92.5 fL (ref 80.0–100.0)
MONOS PCT: 9 %
MPV: 10.9 fL (ref 7.5–12.5)
Monocytes Absolute: 252 cells/uL (ref 200–950)
NEUTROS ABS: 728 {cells}/uL — AB (ref 1500–7800)
Neutrophils Relative %: 26 %
Platelets: 191 10*3/uL (ref 140–400)
RBC: 4.28 MIL/uL (ref 4.20–5.80)
RDW: 13.6 % (ref 11.0–15.0)
WBC: 2.8 10*3/uL — ABNORMAL LOW (ref 3.8–10.8)

## 2015-06-10 LAB — COMPLETE METABOLIC PANEL WITH GFR
ALBUMIN: 4 g/dL (ref 3.6–5.1)
ALK PHOS: 69 U/L (ref 40–115)
ALT: 20 U/L (ref 9–46)
AST: 19 U/L (ref 10–35)
BILIRUBIN TOTAL: 0.4 mg/dL (ref 0.2–1.2)
BUN: 19 mg/dL (ref 7–25)
CALCIUM: 8.8 mg/dL (ref 8.6–10.3)
CO2: 25 mmol/L (ref 20–31)
Chloride: 106 mmol/L (ref 98–110)
Creat: 1.12 mg/dL (ref 0.70–1.25)
GFR, EST NON AFRICAN AMERICAN: 70 mL/min (ref >=60)
GFR, Est African American: 80 mL/min (ref >=60)
Glucose, Bld: 102 mg/dL — ABNORMAL HIGH (ref 70–99)
Potassium: 4.5 mmol/L (ref 3.5–5.3)
SODIUM: 141 mmol/L (ref 135–146)
TOTAL PROTEIN: 6.8 g/dL (ref 6.1–8.1)

## 2015-06-10 LAB — LIPID PANEL
Cholesterol: 177 mg/dL (ref 125–200)
HDL: 69 mg/dL (ref >=40)
LDL Cholesterol: 89 mg/dL (ref <130)
Total CHOL/HDL Ratio: 2.6 Ratio (ref <=5.0)
Triglycerides: 94 mg/dL (ref <150)
VLDL: 19 mg/dL (ref <30)

## 2015-06-11 LAB — PSA: PSA: 0.86 ng/mL (ref <=4.00)

## 2015-07-03 ENCOUNTER — Encounter: Payer: Self-pay | Admitting: *Deleted

## 2015-07-03 ENCOUNTER — Other Ambulatory Visit: Payer: Self-pay | Admitting: *Deleted

## 2015-07-03 MED ORDER — ATORVASTATIN CALCIUM 10 MG PO TABS: 10.0000 mg | ORAL_TABLET | Freq: Every day | ORAL | Status: DC

## 2015-11-06 ENCOUNTER — Encounter: Payer: Self-pay | Admitting: Podiatry

## 2015-11-06 ENCOUNTER — Ambulatory Visit (INDEPENDENT_AMBULATORY_CARE_PROVIDER_SITE_OTHER): Payer: BLUE CROSS/BLUE SHIELD | Admitting: Podiatry

## 2015-11-06 DIAGNOSIS — M79674 Pain in right toe(s): Secondary | ICD-10-CM | POA: Diagnosis not present

## 2015-11-06 DIAGNOSIS — M79605 Pain in left leg: Secondary | ICD-10-CM

## 2015-11-06 DIAGNOSIS — B351 Tinea unguium: Secondary | ICD-10-CM | POA: Diagnosis not present

## 2015-11-06 DIAGNOSIS — M79675 Pain in left toe(s): Secondary | ICD-10-CM

## 2015-11-06 DIAGNOSIS — M79604 Pain in right leg: Secondary | ICD-10-CM

## 2015-11-06 MED ORDER — TERBINAFINE HCL 250 MG PO TABS: ORAL_TABLET | ORAL | 0 refills | Status: DC

## 2015-11-06 NOTE — Progress Notes (Signed)
Subjective:     Patient ID: Cody [name], male   DOB: 1950-12-31, 65 y.o.   MRN: [id number]  HPI patient states his nails are improved but they still get thick yellow and brittle and he cannot cut them himself   Review of Systems     Objective:   Physical Exam Neurovascular status intact with mycotic nail infection 1 through 5 both feet that's improved with oral Lamisil but still present distal with moderate pain    Assessment:     H&P conditions reviewed and at this time I've recommended pulse Lamisil therapy and I debrided the deep nailbeds that are still mycotic    Plan:     Reviewed pulse Lamisil therapy and utilization of debridement which was accomplished today

## 2015-12-10 ENCOUNTER — Other Ambulatory Visit: Payer: Self-pay | Admitting: Family Medicine

## 2015-12-10 ENCOUNTER — Ambulatory Visit (INDEPENDENT_AMBULATORY_CARE_PROVIDER_SITE_OTHER): Payer: BLUE CROSS/BLUE SHIELD | Admitting: Family Medicine

## 2015-12-10 VITALS — BP 130/84 | HR 60 | Temp 97.7°F | Resp 16 | Wt 192.0 lb

## 2015-12-10 DIAGNOSIS — Z125 Encounter for screening for malignant neoplasm of prostate: Secondary | ICD-10-CM

## 2015-12-10 DIAGNOSIS — Z8673 Personal history of transient ischemic attack (TIA), and cerebral infarction without residual deficits: Secondary | ICD-10-CM

## 2015-12-10 NOTE — Progress Notes (Signed)
Subjective:    Patient ID: Cody [name], male    DOB: 1950/11/02, 65 y.o.   MRN: [id number]  HPI7/12/16 Patient has not been seen in more than a year. He presents today with worsening memory loss over the last 2 months. He recently has gotten in trouble at work on 2 separate occasions for performing the wrong task. His task are written out for him and he is unable to comprehend the directions and follow them. 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 He also frequently forgets conversations and things that she is told him. Patient states that his coworkers have even stated that he has talked incoherently at times and made little sense. The patient is unable to give me specific examples. His neurologic exam today is completely normal. Cranial nerves II through XII are grossly intact. Muscle strength is 5 over 5 equal and symmetric in the upper and lower extremities. He has normal reflexes. He has a negative Romberg sign. On Mini-Mental status exam he scored 30 out of 30. He is able to spell world backwards. However he has a great difficulty if I asked the patient to perform serial sevens and frequently gets confused. 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  He also reports chronic daily headaches that seem to be intensifying. Therefore I'm going to schedule the patient for an MRI of the brain. I will also check a CMP, TSH, and a vitamin B12  09/27/14 Patient's labs were completely normal. MRI results are listed below: No acute intracranial findings. No features to strongly suggest complicated migraine.  Remote RIGHT anterior caudate periventricular infarct, may have slight susceptibility suggesting prior  hemorrhage.  Cannot exclude a RIGHT internal carotid artery cavernous sinus aneurysm. 6 x 7 mm cross-section. Recommend MRA intracranial for further evaluation.  MRA revealed that to be a spurious finding. There was no aneurysm seen on MRA. Patient is here today accompanied by his wife to discuss the results. She is very concerned about his memory loss. In addition to what he mentioned to me last time, the wife states he occasionally will become disoriented while driving and forget where he is located.  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 In addition she states that he is more forgetful in daily activities including conversations that they're having.  At that time, my plan was: I believe the patient has early signs of dementia. Therefore we will begin the patient on Aricept 5 mg a day and increase to 10 mg a day in one month. I would like to see him back in 6 months for follow-up. At that time we can see if the patient is deteriorating. If there is evidence of deterioration we may want to consider adding Namenda. If the patient is stable, we may want to temporarily discontinue the medication to see if he truly in fact is benefiting from it. I am concerned by the stroke seen on his MRI. Therefore I'll start the patient on aspirin 81 mg by mouth daily. I've also asked him to return fasting for fasting lipid panel. Goal LDL cholesterol is less than 70.  05/30/15 Has not  had the fasting labs I requested.  He states he will be glad to come in Monday fasting for lab work. He is being compliant taking a baby aspirin every day. He tried to increase Aricept to 10 mg but had weird vivid dreams which made him cut the dosage back to 5 mg. He is tolerating the 5 mg without difficulty. He states that he feels better on the 5 mg. He states that he has not had any issues at work. He states that he is not gotten lost driving. His wife has noticed improvement in his memory and in his  concentration.  I am not sure if this is a placebo effect however the patient does seem to be brighter, more responsive, and appropriately answering questions more fluidly without hesitations. Colonoscopy is up-to-date. He declines hepatitis C screening.  AT that time, my plan was:  Overall the patient seems to be doing much better. I question whether the patient has true dementia or whether he was experiencing memory loss possibly due to another underlying problem such as depression, etc. I gave the patient the option of discontinuing the medication to see if he notices a change however the patient would like to continue the medication at least for the next 6 months which I believe is reasonable. I would have him come in fasting for lab work. At that time we can check him for prostate cancer as well as get a fasting lipid panel. His goal LDL cholesterol is less than 70. Continue aspirin. Colonoscopy is up-to-date. He declines hepatitis C screening   12/10/15 Since I last saw the patient he discontinued Aricept. He has not noticed any decline in his memory since doing this. In fact he still feels like he is doing better. He is retired from his previous job. 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 He does not find himself getting lost when he is driving. His wife does not notice any deterioration in his memory. He is taking an aspirin. Of note his MRI that he had earlier this year did reveal a remote right caudate CVA. However he never got the Lipitor. Please see his fasting lipid panel from earlier this urine May. His LDL cholesterol was excellent and his HDL cholesterol is excellent. I mainly with starting Lipitor because of his history of a CVA found coincidentally on an MRI to be quite honest with his good LDL cholesterol and his good HDL cholesterol 1 to make an argument that the Lipitor is unnecessary. The patient never filled the prescription. I had a discussion with him today regarding this  and I am okay with him not taking Lipitor as long as he continues to take the aspirin. He is due for prostate cancer screening. His flu shot is up-to-date. However he is been getting headaches recently. He states that he had a headache for approximately 2 or 3 days prior to today. It was located over his right temple. It was a dull constant pressure. The headache went away with [facility] powders. He has a history of recurrent migraines area and we discussed in detail the risk of taking high-dose caffeine developing recurrent headaches and I recommended against that. At the present time however he is asymptomatic and headache is resolved Past Medical History:  Diagnosis Date  . Allergy   . Bradycardia   . CVA (cerebral infarction)   . Leukopenia   . Migraine headache    Past Surgical History:  Procedure Laterality Date  . SHOULDER  SURGERY     right   Current Outpatient Prescriptions on File Prior to Visit  Medication Sig Dispense Refill  . aspirin EC 81 MG tablet Take 81 mg by mouth daily.    Marland Kitchen. atorvastatin (LIPITOR) 10 MG tablet Take 1 tablet (10 mg total) by mouth daily. (Patient not taking: Reported on 12/10/2015) 90 tablet 3  . donepezil (ARICEPT) 5 MG tablet Take 1 tablet (5 mg total) by mouth at bedtime. (Patient not taking: Reported on 12/10/2015) 30 tablet 5  . terbinafine (LAMISIL) 250 MG tablet Please take one a day x 7days, repeat every 4 weeks x 4 months (Patient not taking: Reported on 12/10/2015) 28 tablet 0   No current facility-administered medications on file prior to visit.    Allergies  Allergen Reactions  . Codeine     Sick feeling   Social History   Social History  . Marital status: Married    Spouse name: N/A  . Number of children: N/A  . Years of education: N/A   Occupational History  . Not on file.   Social History Main Topics  . Smoking status: Never Smoker  . Smokeless tobacco: Never Used  . Alcohol use No  . Drug use: No  . Sexual activity: Not on file    Other Topics Concern  . Not on file   Social History Narrative  . No narrative on file   No family history on file.    Review of Systems  All other systems reviewed and are negative.      Objective:   Physical Exam  Constitutional: He is oriented to person, place, and time. He appears well-developed and well-nourished.  Cardiovascular: Normal rate, regular rhythm, normal heart sounds and intact distal pulses.   No murmur heard. Pulmonary/Chest: Effort normal and breath sounds normal.  Abdominal: Soft. Bowel sounds are normal.  Neurological: He is alert and oriented to person, place, and time. He has normal reflexes. No cranial nerve deficit. He exhibits normal muscle tone. Coordination normal.  Vitals reviewed.         Assessment & Plan:  Prostate cancer screening - Plan: CBC with Differential/Platelet, COMPLETE METABOLIC PANEL WITH GFR, PSA Migraine history Tension HA   Patient's blood pressures well controlled. His cholesterol is acceptable. Continue aspirin due to his history of CVA. I will screen the patient for prostate cancer. I believe his headaches are likely tension headaches versus atypical migraines. I recommended against BC powders and did recommend Imitrex on an as-needed basis for migraines. Flu shot is up-to-date

## 2015-12-11 LAB — CBC WITH DIFFERENTIAL/PLATELET
BASOS ABS: 40 {cells}/uL (ref 0–200)
Basophils Relative: 1 %
EOS ABS: 80 {cells}/uL (ref 15–500)
Eosinophils Relative: 2 %
HCT: 37.8 % — ABNORMAL LOW (ref 38.5–50.0)
Hemoglobin: 12.4 g/dL — ABNORMAL LOW (ref 13.0–17.0)
LYMPHS PCT: 56 %
Lymphs Abs: 2240 cells/uL (ref 850–3900)
MCH: 30.5 pg (ref 27.0–33.0)
MCHC: 32.8 g/dL (ref 32.0–36.0)
MCV: 92.9 fL (ref 80.0–100.0)
MONOS PCT: 7 %
MPV: 10.7 fL (ref 7.5–12.5)
Monocytes Absolute: 280 cells/uL (ref 200–950)
Neutro Abs: 1360 cells/uL — ABNORMAL LOW (ref 1500–7800)
Neutrophils Relative %: 34 %
PLATELETS: 181 10*3/uL (ref 140–400)
RBC: 4.07 MIL/uL — ABNORMAL LOW (ref 4.20–5.80)
RDW: 13.2 % (ref 11.0–15.0)
WBC: 4 10*3/uL (ref 3.8–10.8)

## 2015-12-11 LAB — COMPLETE METABOLIC PANEL WITH GFR
ALT: 12 U/L (ref 9–46)
AST: 15 U/L (ref 10–35)
Albumin: 3.9 g/dL (ref 3.6–5.1)
Alkaline Phosphatase: 72 U/L (ref 40–115)
BUN: 18 mg/dL (ref 7–25)
CHLORIDE: 107 mmol/L (ref 98–110)
CO2: 24 mmol/L (ref 20–31)
Calcium: 9.4 mg/dL (ref 8.6–10.3)
Creat: 1.21 mg/dL (ref 0.70–1.25)
GFR, EST AFRICAN AMERICAN: 73 mL/min (ref >=60)
GFR, EST NON AFRICAN AMERICAN: 63 mL/min (ref >=60)
Glucose, Bld: 125 mg/dL — ABNORMAL HIGH (ref 70–99)
Potassium: 4.2 mmol/L (ref 3.5–5.3)
Sodium: 141 mmol/L (ref 135–146)
Total Bilirubin: 0.5 mg/dL (ref 0.2–1.2)
Total Protein: 6.6 g/dL (ref 6.1–8.1)

## 2015-12-11 LAB — PSA: PSA: 0.6 ng/mL (ref <=4.0)

## 2015-12-17 LAB — HEMOGLOBIN A1C
HEMOGLOBIN A1C: 5.1 % (ref <5.7)
Mean Plasma Glucose: 100 mg/dL

## 2016-06-12 ENCOUNTER — Encounter: Payer: Self-pay | Admitting: Family Medicine

## 2016-06-12 ENCOUNTER — Ambulatory Visit (INDEPENDENT_AMBULATORY_CARE_PROVIDER_SITE_OTHER): Payer: BLUE CROSS/BLUE SHIELD | Admitting: Family Medicine

## 2016-06-12 VITALS — BP 122/80 | HR 60 | Temp 98.0°F | Resp 14 | Ht 68.0 in | Wt 193.0 lb

## 2016-06-12 DIAGNOSIS — R413 Other amnesia: Secondary | ICD-10-CM

## 2016-06-12 DIAGNOSIS — Z8673 Personal history of transient ischemic attack (TIA), and cerebral infarction without residual deficits: Secondary | ICD-10-CM | POA: Diagnosis not present

## 2016-06-12 DIAGNOSIS — Z23 Encounter for immunization: Secondary | ICD-10-CM | POA: Diagnosis not present

## 2016-06-12 DIAGNOSIS — Z0000 Encounter for general adult medical examination without abnormal findings: Secondary | ICD-10-CM | POA: Diagnosis not present

## 2016-06-12 NOTE — Progress Notes (Signed)
Subjective:    Patient ID: Cody [name], male    DOB: Mar 11, 1950, 66 y.o.   MRN: [id number]  HPI7/12/16 Patient has not been seen in more than a year. He presents today with worsening memory loss over the last 2 months. He recently has gotten in trouble at work on 2 separate occasions for performing the wrong task. His task are written out for him and he is unable to comprehend the directions and follow them. 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 He also frequently forgets conversations and things that she is told him. Patient states that his coworkers have even stated that he has talked incoherently at times and made little sense. The patient is unable to give me specific examples. His neurologic exam today is completely normal. Cranial nerves II through XII are grossly intact. Muscle strength is 5 over 5 equal and symmetric in the upper and lower extremities. He has normal reflexes. He has a negative Romberg sign. On Mini-Mental status exam he scored 30 out of 30. He is able to spell world backwards. However he has a great difficulty if I asked the patient to perform serial sevens and frequently gets confused. 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  He also reports chronic daily headaches that seem to be intensifying. Therefore I'm going to schedule the patient for an MRI of the brain. I will also check a CMP, TSH, and a vitamin B12  09/27/14 Patient's labs were completely normal. MRI results are listed below: No acute intracranial findings. No features to strongly suggest complicated migraine.  Remote RIGHT anterior caudate periventricular infarct, may have slight susceptibility suggesting prior  hemorrhage.  Cannot exclude a RIGHT internal carotid artery cavernous sinus aneurysm. 6 x 7 mm cross-section. Recommend MRA intracranial for further evaluation.  MRA revealed that to be a spurious finding. There was no aneurysm seen on MRA. Patient is here today accompanied by his wife to discuss the results. She is very concerned about his memory loss. In addition to what he mentioned to me last time, the wife states he occasionally will become disoriented while driving and forget where he is located.  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 In addition she states that he is more forgetful in daily activities including conversations that they're having.  At that time, my plan was: I believe the patient has early signs of dementia. Therefore we will begin the patient on Aricept 5 mg a day and increase to 10 mg a day in one month. I would like to see him back in 6 months for follow-up. At that time we can see if the patient is deteriorating. If there is evidence of deterioration we may want to consider adding Namenda. If the patient is stable, we may want to temporarily discontinue the medication to see if he truly in fact is benefiting from it. I am concerned by the stroke seen on his MRI. Therefore I'll start the patient on aspirin 81 mg by mouth daily. I've also asked him to return fasting for fasting lipid panel. Goal LDL cholesterol is less than 70.  05/30/15 Has not  had the fasting labs I requested.  He states he will be glad to come in Monday fasting for lab work. He is being compliant taking a baby aspirin every day. He tried to increase Aricept to 10 mg but had weird vivid dreams which made him cut the dosage back to 5 mg. He is tolerating the 5 mg without difficulty. He states that he feels better on the 5 mg. He states that he has not had any issues at work. He states that he is not gotten lost driving. His wife has noticed improvement in his memory and in his  concentration.  I am not sure if this is a placebo effect however the patient does seem to be brighter, more responsive, and appropriately answering questions more fluidly without hesitations. Colonoscopy is up-to-date. He declines hepatitis C screening.  At that time, my plan was: Overall the patient seems to be doing much better. I question whether the patient has true dementia or whether he was experiencing memory loss possibly due to another underlying problem such as depression, etc. I gave the patient the option of discontinuing the medication to see if he notices a change however the patient would like to continue the medication at least for the next 6 months which I believe is reasonable. I would have him come in fasting for lab work. At that time we can check him for prostate cancer as well as get a fasting lipid panel. His goal LDL cholesterol is less than 70. Continue aspirin. Colonoscopy is up-to-date. He declines hepatitis C screening  06/12/16 Patient's PSA was checked in October and was 0.6. He is here today for complete physical exam. He continues to do much better since he is retired. I no longer feel that the patient is showing early signs of dementia. His memory seems as good if not better than it was 2 years ago. He is still taking Aricept but I doubt that he seeing any benefit from this medication. He denies any further incidents of getting lost while driving. His wife is no longer concerned about his memory. He does not find himself forgetting things. He denies any word finding aphasia. He is not forgetting people's names. Immunizations are up-to-date. He's had the shingles vaccine. He is due for a tetanus shot today. He is not due for a pneumonia vaccine until next year. Colonoscopy was checked in 2014 is up-to-date. He is due for hepatitis C screening. He is still taking an aspirin given his history of remote CVA. He quit taking his Lipitor. Past Medical History:  Diagnosis Date  .  Allergy   . Bradycardia   . CVA (cerebral infarction)   . Leukopenia   . Migraine headache    Past Surgical History:  Procedure Laterality Date  . SHOULDER SURGERY     right   Current Outpatient Prescriptions on File Prior to Visit  Medication Sig Dispense Refill  . aspirin EC 81 MG tablet Take 81 mg by mouth daily.    Marland Kitchen donepezil (ARICEPT) 5 MG tablet Take 1 tablet (5 mg total) by mouth at bedtime. 30 tablet 5   No current facility-administered medications on file prior to visit.    Allergies  Allergen Reactions  . Codeine     Sick feeling   Social History   Social History  . Marital status: Married    Spouse name: N/A  . Number of children: N/A  . Years of education: N/A   Occupational History  . Not on file.  Social History Main Topics  . Smoking status: Never Smoker  . Smokeless tobacco: Never Used  . Alcohol use No  . Drug use: No  . Sexual activity: Not on file   Other Topics Concern  . Not on file   Social History Narrative  . No narrative on file   No family history on file.    Review of Systems  All other systems reviewed and are negative.      Objective:   Physical Exam  Constitutional: He is oriented to person, place, and time. He appears well-developed and well-nourished. No distress.  HENT:  Head: Normocephalic and atraumatic.  Right Ear: External ear normal.  Left Ear: External ear normal.  Nose: Nose normal.  Mouth/Throat: Oropharynx is clear and moist. No oropharyngeal exudate.  Eyes: Conjunctivae and EOM are normal. Pupils are equal, round, and reactive to light. Right eye exhibits no discharge. Left eye exhibits no discharge. No scleral icterus.  Neck: Normal range of motion. Neck supple. No JVD present. No tracheal deviation present. No thyromegaly present.  Cardiovascular: Normal rate, regular rhythm and intact distal pulses.  Exam reveals no gallop and no friction rub.   Murmur heard. Pulmonary/Chest: Effort normal and breath  sounds normal. No stridor. No respiratory distress. He has no wheezes. He has no rales. He exhibits no tenderness.  Abdominal: Soft. Bowel sounds are normal. He exhibits no distension and no mass. There is no tenderness. There is no rebound and no guarding.  Musculoskeletal: Normal range of motion. He exhibits no edema, tenderness or deformity.  Lymphadenopathy:    He has no cervical adenopathy.  Neurological: He is alert and oriented to person, place, and time. He has normal reflexes. He displays normal reflexes. No cranial nerve deficit. He exhibits normal muscle tone. Coordination normal.  Skin: Skin is warm. No rash noted. He is not diaphoretic. No erythema. No pallor.  Psychiatric: He has a normal mood and affect. His behavior is normal. Judgment and thought content normal.  Vitals reviewed.   Faint 1/6 sem over av.      Assessment & Plan:  H/O: CVA (cerebrovascular accident) - Plan: COMPLETE METABOLIC PANEL WITH GFR, Lipid panel  Memory loss  General medical exam - Plan: CBC with Differential/Platelet, COMPLETE METABOLIC PANEL WITH GFR, Lipid panel, Hepatitis C Ab Reflex HCV RNA, QUANT His physical exam today is normal. He states that he's had a soft murmur off and on for decades. Immunizations are up-to-date except for his tetanus shot. He received a tetanus shot today. He will be due for Pneumovax next year. His colonoscopy is up-to-date. He politely declined a digital rectal exam. PSA was checked in October 2017 and was normal. I last the patient to return for a CBC, CMP, fasting lipid panel, and hepatitis C screening. I believe I was initially wrong. I no longer believe the patient is showing early signs of dementia. I believe the memory loss he is experiencing 2 years ago may have been due to depression or stress that I misinterpreted as early signs of dementia. I no longer feel that the patient is benefiting and off of Aricept. I have asked the patient to discontinue the medicine at  this time. Monitor clinically

## 2016-06-12 NOTE — Addendum Note (Signed)
Addended by: Legrand Rams B on: 06/12/2016 03:36 PM   Modules accepted: Orders

## 2016-06-15 ENCOUNTER — Other Ambulatory Visit: Payer: Self-pay | Admitting: Family Medicine

## 2016-06-15 DIAGNOSIS — R413 Other amnesia: Secondary | ICD-10-CM

## 2016-06-18 ENCOUNTER — Other Ambulatory Visit: Payer: BLUE CROSS/BLUE SHIELD

## 2016-06-18 LAB — COMPLETE METABOLIC PANEL WITH GFR
ALBUMIN: 4.1 g/dL (ref 3.6–5.1)
ALK PHOS: 84 U/L (ref 40–115)
ALT: 12 U/L (ref 9–46)
AST: 14 U/L (ref 10–35)
BILIRUBIN TOTAL: 0.5 mg/dL (ref 0.2–1.2)
BUN: 21 mg/dL (ref 7–25)
CO2: 21 mmol/L (ref 20–31)
Calcium: 9.4 mg/dL (ref 8.6–10.3)
Chloride: 107 mmol/L (ref 98–110)
Creat: 1.17 mg/dL (ref 0.70–1.25)
GFR, EST AFRICAN AMERICAN: 76 mL/min (ref >=60)
GFR, EST NON AFRICAN AMERICAN: 65 mL/min (ref >=60)
Glucose, Bld: 114 mg/dL — ABNORMAL HIGH (ref 70–99)
POTASSIUM: 4.7 mmol/L (ref 3.5–5.3)
SODIUM: 139 mmol/L (ref 135–146)
TOTAL PROTEIN: 7.2 g/dL (ref 6.1–8.1)

## 2016-06-18 LAB — LIPID PANEL
CHOL/HDL RATIO: 2.6 ratio (ref <5.0)
Cholesterol: 194 mg/dL (ref <200)
HDL: 76 mg/dL (ref >40)
LDL Cholesterol: 96 mg/dL (ref <100)
TRIGLYCERIDES: 109 mg/dL (ref <150)
VLDL: 22 mg/dL (ref <30)

## 2016-06-18 LAB — CBC WITH DIFFERENTIAL/PLATELET
Basophils Absolute: 0 cells/uL (ref 0–200)
Basophils Relative: 0 %
EOS PCT: 0 %
Eosinophils Absolute: 0 cells/uL — ABNORMAL LOW (ref 15–500)
HCT: 37.2 % — ABNORMAL LOW (ref 38.5–50.0)
Hemoglobin: 11.8 g/dL — ABNORMAL LOW (ref 13.0–17.0)
LYMPHS ABS: 1920 {cells}/uL (ref 850–3900)
LYMPHS PCT: 20 %
MCH: 29.9 pg (ref 27.0–33.0)
MCHC: 31.7 g/dL — AB (ref 32.0–36.0)
MCV: 94.4 fL (ref 80.0–100.0)
MONOS PCT: 8 %
MPV: 11 fL (ref 7.5–12.5)
Monocytes Absolute: 768 cells/uL (ref 200–950)
NEUTROS PCT: 72 %
Neutro Abs: 6912 cells/uL (ref 1500–7800)
PLATELETS: 218 10*3/uL (ref 140–400)
RBC: 3.94 MIL/uL — AB (ref 4.20–5.80)
RDW: 13.9 % (ref 11.0–15.0)
WBC: 9.6 10*3/uL (ref 3.8–10.8)

## 2016-06-19 ENCOUNTER — Encounter: Payer: Self-pay | Admitting: Family Medicine

## 2016-06-19 ENCOUNTER — Other Ambulatory Visit: Payer: Self-pay | Admitting: *Deleted

## 2016-06-19 DIAGNOSIS — R7303 Prediabetes: Secondary | ICD-10-CM | POA: Insufficient documentation

## 2016-06-19 LAB — HEPATITIS C ANTIBODY: HCV Ab: NEGATIVE

## 2016-06-19 MED ORDER — ATORVASTATIN CALCIUM 20 MG PO TABS: 20.0000 mg | ORAL_TABLET | Freq: Every day | ORAL | 3 refills | Status: DC

## 2016-07-16 ENCOUNTER — Other Ambulatory Visit: Payer: Self-pay | Admitting: Family Medicine

## 2016-07-16 DIAGNOSIS — R413 Other amnesia: Secondary | ICD-10-CM

## 2016-09-26 IMAGING — MR MR MRA HEAD W/O CM
1 series · 26 of 48 positions shown · non-contrast
Comparison: Brain MRI 09/11/2014, 05/09/2003.

CLINICAL DATA: 64-year-old male with chronic headaches and
sensitive 82 light and smells. Possible right ICA cavernous segment
aneurysm on noncontrast brain MRI. Subsequent encounter.

EXAM:
MRA HEAD WITHOUT CONTRAST
TECHNIQUE: Angiographic images of the Circle of Willis were obtained using MRA
technique without intravenous contrast.

[Series 4: [id number]_multi-slab · axial · 0.7mm · 0.37mm/px · z∈[-72,+27]mm · 26 of 150 slices shown]
[im 1/150]
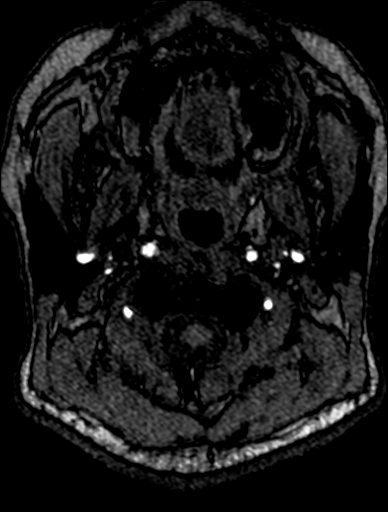
[im 4/150]
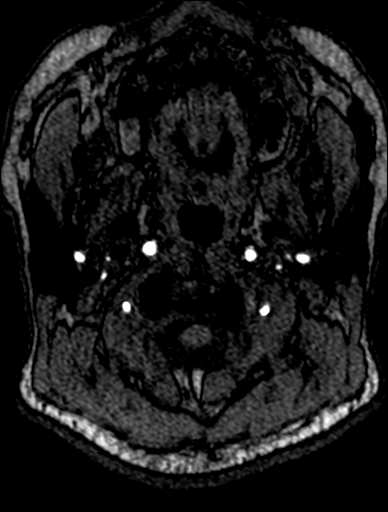
[im 7/150]
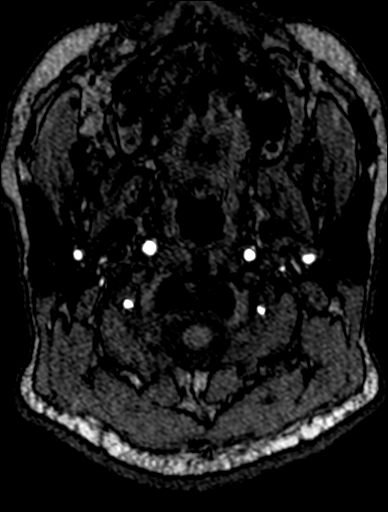
[im 10/150]
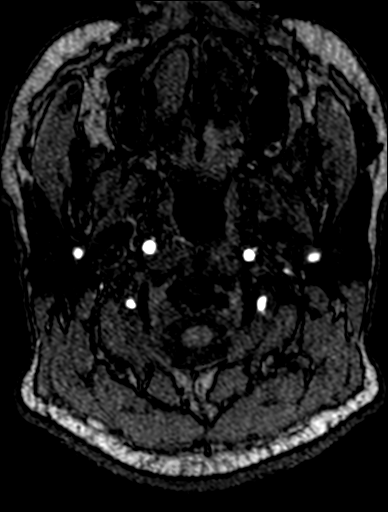
[im 13/150]
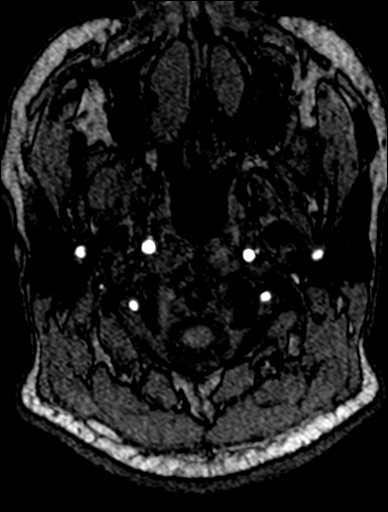
[im 16/150]
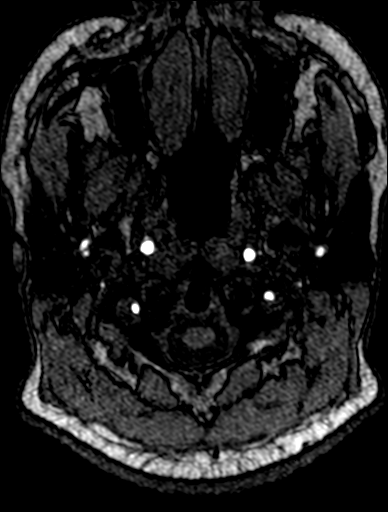
[im 20/150]
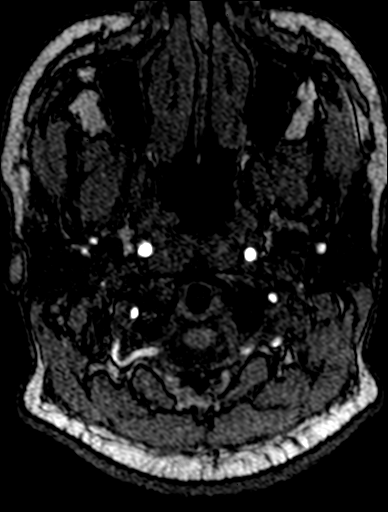
[im 23/150]
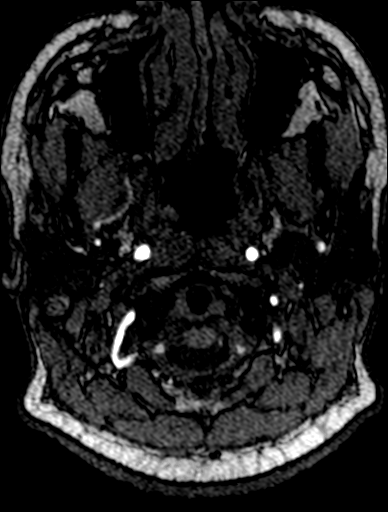
[im 26/150]
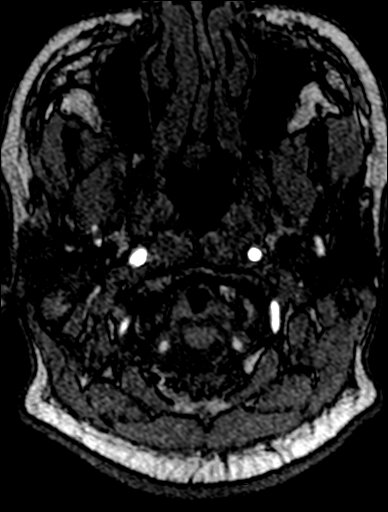
[im 29/150]
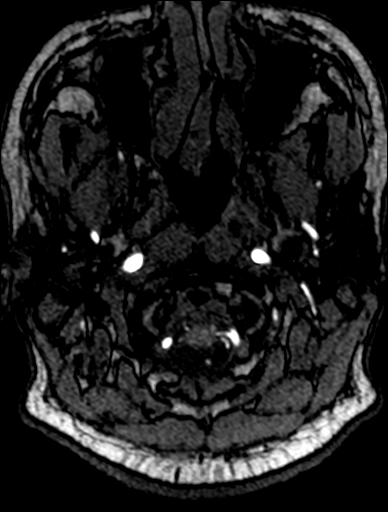
[im 32/150]
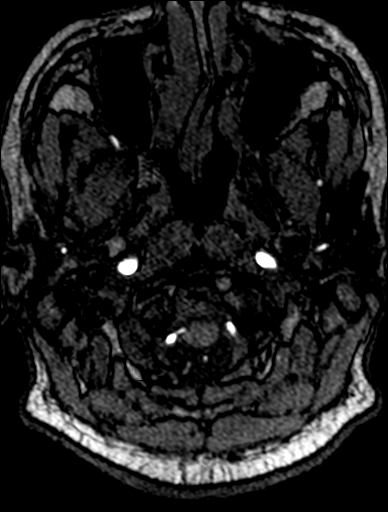
[im 35/150]
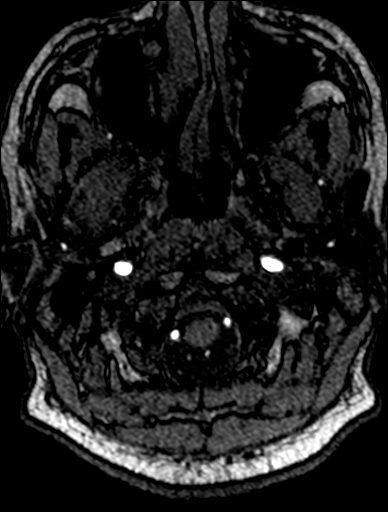
[im 39/150]
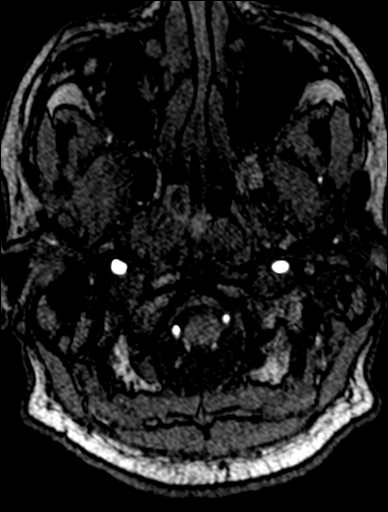
[im 42/150]
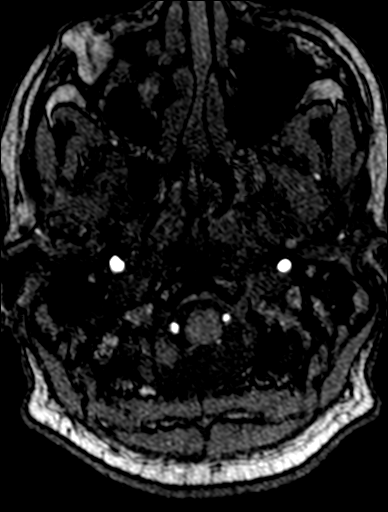
[im 45/150]
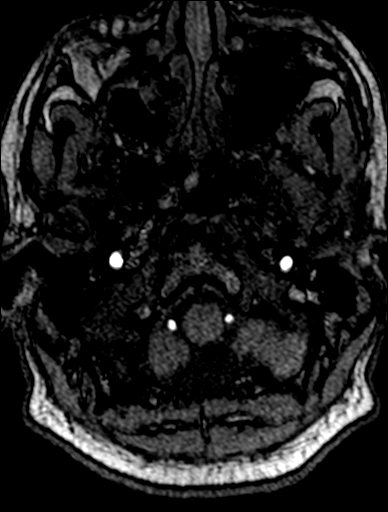
[im 48/150]
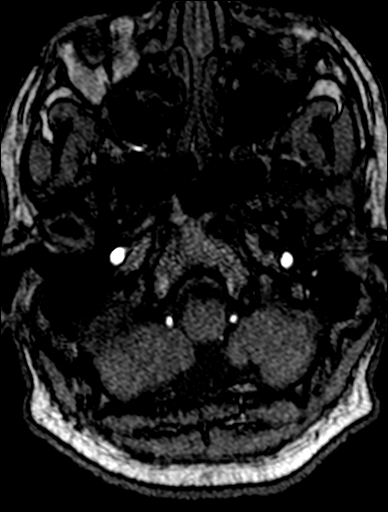
[im 51/150]
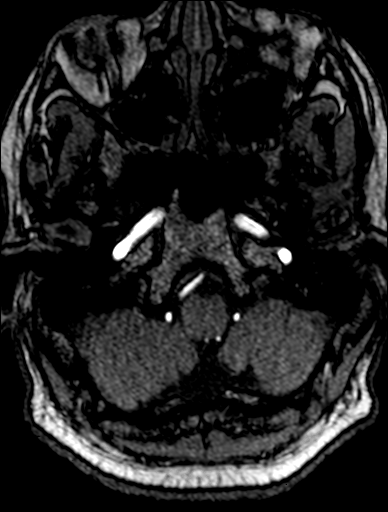
[im 54/150]
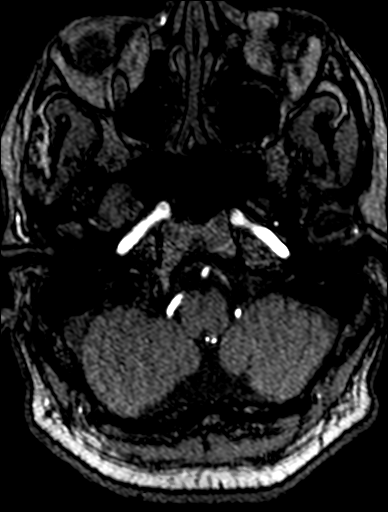
[im 58/150]
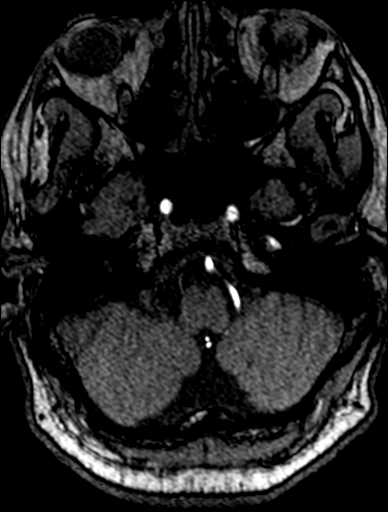
[im 67/150]
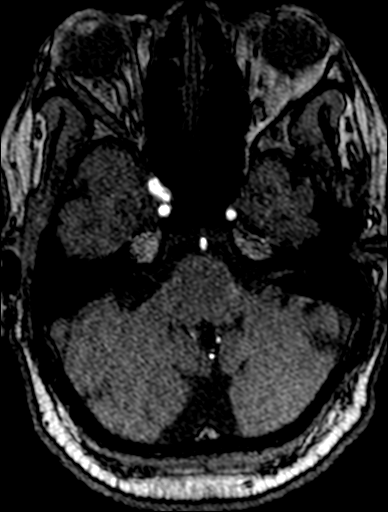
[im 77/150]
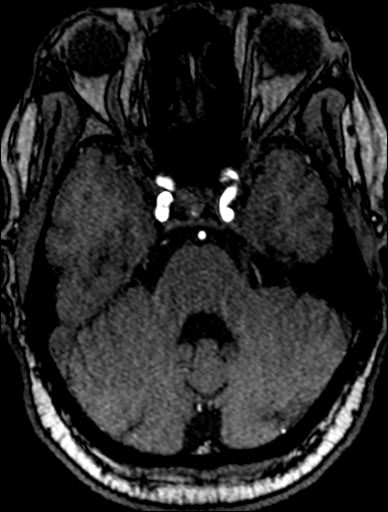
[im 86/150]
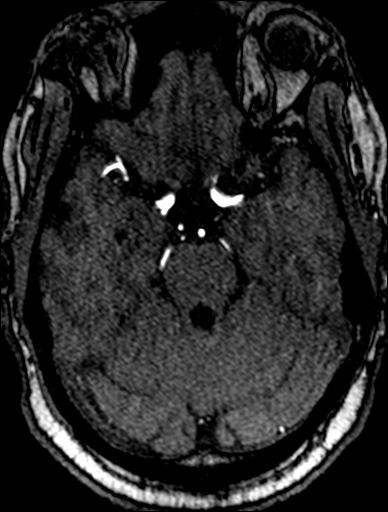
[im 105/150]
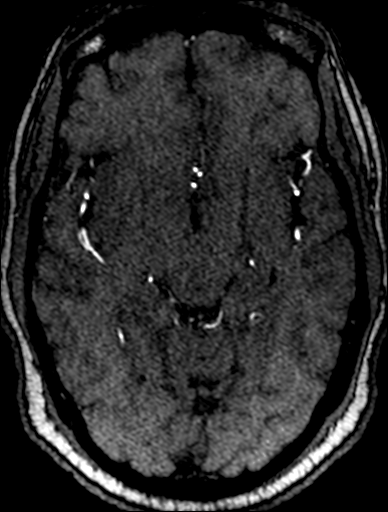
[im 124/150]
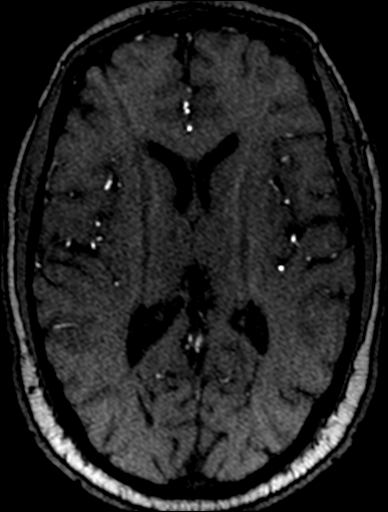
[im 127/150]
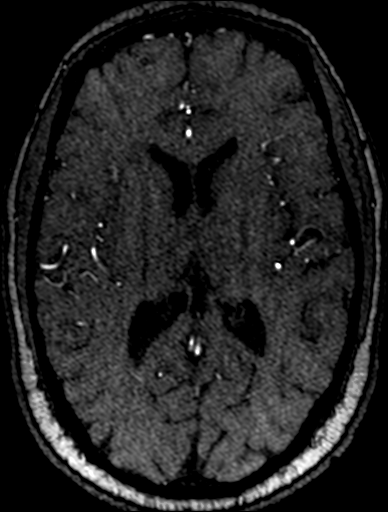
[im 143/150]
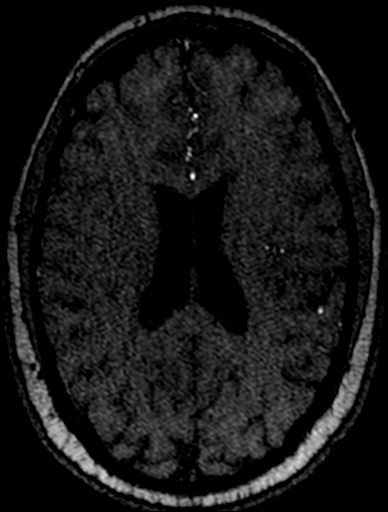

[26 of 48 positions shown; findings below may reference images not displayed]

FINDINGS: Stable ventricle size and configuration. No intracranial mass
effect.

Antegrade flow in the posterior circulation with codominant distal
vertebral arteries. Normal PICA origins. Normal vertebrobasilar
junction. No basilar stenosis. SCA and left PCA origins are normal.
Fetal type right PCA origin. The left posterior communicating artery
also is present. Bilateral PCA branches are within normal limits.

Antegrade flow in both ICA siphons. Tortuous cavernous ICA segments,
more so the right. However, there is no ICA aneurysm. Ophthalmic and
posterior communicating artery origins are normal. Patent carotid
termini. Mildly dominant right ACA A1 segment. Anterior
communicating artery and visualized ACA branches are within normal
limits ; median artery of the corpus callosum is present. Bilateral
MCA origins, M1 segments, and visualized MCA branches are within
normal limits.
IMPRESSION: Negative intracranial MRA.

No right ICA aneurysm, the recent MRI appearance was related to
tortuosity of the right ICA cavernous segment.

## 2016-10-23 ENCOUNTER — Ambulatory Visit (INDEPENDENT_AMBULATORY_CARE_PROVIDER_SITE_OTHER): Payer: BLUE CROSS/BLUE SHIELD | Admitting: Family Medicine

## 2016-10-23 ENCOUNTER — Encounter: Payer: Self-pay | Admitting: Family Medicine

## 2016-10-23 VITALS — BP 118/78 | HR 70 | Temp 98.0°F | Resp 12 | Ht 68.0 in | Wt 189.0 lb

## 2016-10-23 DIAGNOSIS — J069 Acute upper respiratory infection, unspecified: Secondary | ICD-10-CM | POA: Diagnosis not present

## 2016-10-23 MED ORDER — HYDROCOD POLST-CPM POLST ER 10-8 MG/5ML PO SUER: 5.0000 mL | Freq: Two times a day (BID) | ORAL | 0 refills | Status: DC | PRN

## 2016-10-23 MED ORDER — FLUTICASONE PROPIONATE 50 MCG/ACT NA SUSP: 2.0000 | Freq: Every day | NASAL | 1 refills | Status: DC

## 2016-10-23 NOTE — Patient Instructions (Addendum)
Robitussin DM in the day  Use tussionex at bedtime Use nasal spray  Call me next week if not getting better F/U as needed

## 2016-10-23 NOTE — Progress Notes (Signed)
   Subjective:    Patient ID: Cody [name], male    DOB: 1950-12-11, 66 y.o.   MRN: [id number]  Patient presents for Cough and URI (x1week)  Cough with congestion  With small clear mucus production for past week, no fever, no chills. Nasal congestion. No sore trhoat   Taking nyquil. Has had some headache, but has underlying migraines.  No GI symptoms, no diarrhea  + sick contacts   Review Of Systems:  GEN- denies fatigue, fever, weight loss,weakness, recent illness HEENT- denies eye drainage, change in vision, nasal discharge, CVS- denies chest pain, palpitations RESP- denies SOB, +cough, wheeze ABD- denies N/V, change in stools, abd pain GU- denies dysuria, hematuria, dribbling, incontinence MSK- denies joint pain, muscle aches, injury Neuro- denies headache, dizziness, syncope, seizure activity       Objective:    BP 118/78   Pulse 70   Temp 98 F (36.7 C) (Oral)   Resp 12   Ht 5\' 8"  (1.727 m)   Wt 189 lb (85.7 kg)   SpO2 99%   BMI 28.74 kg/m  GEN- NAD, alert and oriented x3 HEENT- PERRL, EOMI, non injected sclera, pink conjunctiva, MMM, oropharynx clear, TM clear bilat no effusion,  No  maxillary sinus tenderness,+ inflammed turbinates,  Nasal drainage  Neck- Supple, no LAD CVS- RRR, no murmur RESP-CTAB EXT- No edema Pulses- Radial 2+          Assessment & Plan:      Problem List Items Addressed This Visit    None    Visit Diagnoses    Viral URI    -  Primary   Vira illness, no red flags, given tussionex for betime, robitussim during day,  use nasal saline, flonase      Note: This dictation was prepared with Dragon dictation along with smaller phrase technology. Any transcriptional errors that result from this process are unintentional.

## 2017-01-28 ENCOUNTER — Other Ambulatory Visit: Payer: Self-pay

## 2017-01-28 ENCOUNTER — Ambulatory Visit: Payer: BLUE CROSS/BLUE SHIELD | Admitting: Family Medicine

## 2017-01-28 ENCOUNTER — Encounter: Payer: Self-pay | Admitting: Family Medicine

## 2017-01-28 VITALS — BP 120/72 | HR 55 | Temp 98.0°F | Resp 16 | Ht 68.0 in | Wt 195.0 lb

## 2017-01-28 DIAGNOSIS — M79641 Pain in right hand: Secondary | ICD-10-CM | POA: Diagnosis not present

## 2017-01-28 DIAGNOSIS — M19049 Primary osteoarthritis, unspecified hand: Secondary | ICD-10-CM

## 2017-01-28 DIAGNOSIS — G43809 Other migraine, not intractable, without status migrainosus: Secondary | ICD-10-CM

## 2017-01-28 DIAGNOSIS — M79642 Pain in left hand: Secondary | ICD-10-CM | POA: Diagnosis not present

## 2017-01-28 MED ORDER — KETOROLAC TROMETHAMINE 60 MG/2ML IM SOLN
60.0000 mg | Freq: Once | INTRAMUSCULAR | Status: AC
  Administered 2017-01-28: 60 mg via INTRAMUSCULAR

## 2017-01-28 MED ORDER — DICLOFENAC SODIUM 75 MG PO TBEC: 75.0000 mg | DELAYED_RELEASE_TABLET | Freq: Two times a day (BID) | ORAL | 1 refills | Status: DC

## 2017-01-28 NOTE — Progress Notes (Signed)
   Subjective:    Patient ID: Cody [name], male    DOB: August 29, 1950, 66 y.o.   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   Migraines- has had past few days,  Takes goody pwoder, or tries OTC medications, no change in vision, gets headaches on and off prefers to self treat  No N/V   Review Of Systems:  GEN- denies fatigue, fever, weight loss,weakness, recent illness HEENT- denies eye drainage, change in vision, nasal discharge, CVS- denies chest pain, palpitations RESP- denies SOB, cough, wheeze ABD- denies N/V, change in stools, abd pain GU- denies dysuria, hematuria, dribbling, incontinence MSK- +joint pain, muscle aches, injury Neuro- denies headache, dizziness, syncope, seizure activity       Objective:    BP 120/72   Pulse (!) 55   Temp 98 F (36.7 C) (Oral)   Resp 16   Ht 5\' 8"  (1.727 m)   Wt 195 lb (88.5 kg)   SpO2 97%   BMI 29.65 kg/m  GEN- NAD, alert and oriented x3 HEENT- PERRL, EOMI, non injected sclera, pink conjunctiva, MMM, oropharynx clear CVS- RRR, no murmur RESP-CTAB NEURO-CNII-XII  In tact, no focal deficits  MSK- bilat hand, swelling at PIP, bilat thumbs TTP, neg finkelstein's , able to grasp with pain, no swan deformity FROM wrist , elbows  Ext- No edema Pulses- Radial  2+        Assessment & Plan:      Problem List Items Addressed This Visit      Unprioritized   Migraine headache    Given toradol in office, no red flags for headache  For his  bilat hand pain I expect this to be some type of arthritis. Possible OA vs inflammatory/rheumatoid arthritis Obtain xrays of hands, check labs Given diclofenac to take         Relevant Medications   diclofenac (VOLTAREN) 75 MG EC tablet   ketorolac (TORADOL) injection 60 mg (Completed)    Other Visit Diagnoses    Bilateral hand pain    -  Primary   Relevant Orders   CBC with Differential/Platelet   C-reactive protein   Rheumatoid factor   Sedimentation Rate   DG Hand Complete Right   DG Hand Complete Left   Arthritis of hand       Relevant Medications   diclofenac (VOLTAREN) 75 MG EC tablet   ketorolac (TORADOL) injection 60 mg (Completed)   Other Relevant Orders   DG Hand Complete Right   DG Hand Complete Left      Note: This dictation was prepared with Dragon dictation along with smaller phrase technology. Any transcriptional errors that result from this process are unintentional.

## 2017-01-28 NOTE — Assessment & Plan Note (Signed)
Given toradol in office, no red flags for headache  For his bilat hand pain I expect this to be some type of arthritis. Possible OA vs inflammatory/rheumatoid arthritis Obtain xrays of hands, check labs Given diclofenac to take

## 2017-01-28 NOTE — Patient Instructions (Addendum)
Get the x[facility] done at [facility]  [address] Toradol shot given Get the anti-inflammatory pill We will call with lab results F/U pending results

## 2017-01-29 ENCOUNTER — Other Ambulatory Visit: Payer: Self-pay | Admitting: Family Medicine

## 2017-01-29 DIAGNOSIS — R413 Other amnesia: Secondary | ICD-10-CM

## 2017-01-29 LAB — CBC WITH DIFFERENTIAL/PLATELET
Basophils Absolute: 32 cells/uL (ref 0–200)
Basophils Relative: 1 %
EOS PCT: 1.3 %
Eosinophils Absolute: 42 cells/uL (ref 15–500)
HEMATOCRIT: 36.9 % — AB (ref 38.5–50.0)
Hemoglobin: 12.2 g/dL — ABNORMAL LOW (ref 13.2–17.1)
LYMPHS ABS: 1706 {cells}/uL (ref 850–3900)
MCH: 30.5 pg (ref 27.0–33.0)
MCHC: 33.1 g/dL (ref 32.0–36.0)
MCV: 92.3 fL (ref 80.0–100.0)
MPV: 11.4 fL (ref 7.5–12.5)
Monocytes Relative: 8.9 %
NEUTROS PCT: 35.5 %
Neutro Abs: 1136 cells/uL — ABNORMAL LOW (ref 1500–7800)
Platelets: 197 10*3/uL (ref 140–400)
RBC: 4 10*6/uL — AB (ref 4.20–5.80)
RDW: 12.7 % (ref 11.0–15.0)
Total Lymphocyte: 53.3 %
WBC mixed population: 285 cells/uL (ref 200–950)
WBC: 3.2 10*3/uL — AB (ref 3.8–10.8)

## 2017-01-29 LAB — RHEUMATOID FACTOR

## 2017-01-29 LAB — SEDIMENTATION RATE: Sed Rate: 9 mm/h (ref 0–20)

## 2017-01-29 LAB — C-REACTIVE PROTEIN: CRP: 1.2 mg/L (ref <8.0)

## 2017-02-23 ENCOUNTER — Other Ambulatory Visit: Payer: Self-pay | Admitting: *Deleted

## 2017-02-23 MED ORDER — DICLOFENAC SODIUM 75 MG PO TBEC: 75.0000 mg | DELAYED_RELEASE_TABLET | Freq: Two times a day (BID) | ORAL | 1 refills | Status: DC

## 2017-03-29 ENCOUNTER — Ambulatory Visit: Payer: BLUE CROSS/BLUE SHIELD | Admitting: Family Medicine

## 2017-03-29 ENCOUNTER — Encounter: Payer: Self-pay | Admitting: Family Medicine

## 2017-03-29 VITALS — BP 122/74 | HR 60 | Temp 98.0°F | Resp 14 | Ht 68.0 in | Wt 193.0 lb

## 2017-03-29 DIAGNOSIS — R252 Cramp and spasm: Secondary | ICD-10-CM | POA: Diagnosis not present

## 2017-03-29 NOTE — Progress Notes (Signed)
Subjective:    Patient ID: Cody [name], male    DOB: [birth year], 67 y.o.   MRN: [id number]  HPI Patient reports 3-4 weeks of cramps.  The cramps are primarily occurring in his thighs, mainly his hamstrings, his calf muscles, and even in his feet.  It occurs at night when he is trying to sleep and in the mornings when he tries to stretch.  He denies any recent change in activity or diet.  He denies being dehydrated.  There have been no recent changes in medication.  He denies any claudication.  Cramps resolved spontaneously but are occurring more more frequently Past Medical History:  Diagnosis Date  . Allergy   . Bradycardia   . CVA (cerebral infarction)   . Leukopenia   . Migraine headache   . Prediabetes    Past Surgical History:  Procedure Laterality Date  . SHOULDER SURGERY     right   Current Outpatient Medications on File Prior to Visit  Medication Sig Dispense Refill  . aspirin EC 81 MG tablet Take 81 mg by mouth daily.    Marland Kitchen. atorvastatin (LIPITOR) 20 MG tablet Take 1 tablet (20 mg total) by mouth daily. 90 tablet 3  . diclofenac (VOLTAREN) 75 MG EC tablet Take 1 tablet (75 mg total) by mouth 2 (two) times daily. 180 tablet 1  . donepezil (ARICEPT) 5 MG tablet TAKE 1 TABLET (5 MG TOTAL) BY MOUTH AT BEDTIME. 30 tablet 3  . fluticasone (FLONASE) 50 MCG/ACT nasal spray Place 2 sprays into both nostrils daily. 16 g 1   No current facility-administered medications on file prior to visit.    Allergies  Allergen Reactions  . Codeine     Generalized itching    Social History   Socioeconomic History  . Marital status: Married    Spouse name: Not on file  . Number of children: Not on file  . Years of education: Not on file  . Highest education level: Not on file  Social Needs  . Financial resource strain: Not on file  . Food insecurity - worry: Not on file  . Food insecurity - inability: Not on file  . Transportation needs - medical: Not on file  .  Transportation needs - non-medical: Not on file  Occupational History  . Not on file  Tobacco Use  . Smoking status: Never Smoker  . Smokeless tobacco: Never Used  Substance and Sexual Activity  . Alcohol use: No  . Drug use: No  . Sexual activity: Not on file  Other Topics Concern  . Not on file  Social History Narrative  . Not on file      Review of Systems  All other systems reviewed and are negative.      Objective:   Physical Exam  Constitutional: He is oriented to person, place, and time. He appears well-developed and well-nourished.  Cardiovascular: Normal rate, regular rhythm and normal heart sounds.  No murmur heard. Pulmonary/Chest: Effort normal and breath sounds normal. No respiratory distress. He has no wheezes. He has no rales.  Abdominal: Soft. Bowel sounds are normal. He exhibits no distension. There is no tenderness. There is no rebound.  Musculoskeletal: Normal range of motion. He exhibits no tenderness.  Neurological: He is alert and oriented to person, place, and time. He has normal reflexes. He displays normal reflexes. No cranial nerve deficit. He exhibits normal muscle tone. Coordination normal.          Assessment & Plan:  Nocturnal foot cramps - Plan: CBC with Differential/Platelet, COMPLETE METABOLIC PANEL WITH GFR, Magnesium  Patient is experiencing nocturnal cramps.  I have recommended that he try tonic water with quinine 1-2 glasses/day.  Meanwhile I will check his electrolytes including calcium potassium and magnesium levels to evaluate for any electrolyte disturbances.  If tonic water is not helping and labs are normal, consider muscle relaxers at night versus hydroxychloroquine.

## 2017-03-31 LAB — CBC WITH DIFFERENTIAL/PLATELET
BASOS ABS: 29 {cells}/uL (ref 0–200)
Basophils Relative: 0.8 %
EOS ABS: 68 {cells}/uL (ref 15–500)
Eosinophils Relative: 1.9 %
HCT: 35.9 % — ABNORMAL LOW (ref 38.5–50.0)
Hemoglobin: 12.2 g/dL — ABNORMAL LOW (ref 13.2–17.1)
Lymphs Abs: 1994 cells/uL (ref 850–3900)
MCH: 31 pg (ref 27.0–33.0)
MCHC: 34 g/dL (ref 32.0–36.0)
MCV: 91.1 fL (ref 80.0–100.0)
MPV: 11.5 fL (ref 7.5–12.5)
Monocytes Relative: 8.9 %
NEUTROS PCT: 33 %
Neutro Abs: 1188 cells/uL — ABNORMAL LOW (ref 1500–7800)
PLATELETS: 173 10*3/uL (ref 140–400)
RBC: 3.94 10*6/uL — ABNORMAL LOW (ref 4.20–5.80)
RDW: 11.9 % (ref 11.0–15.0)
TOTAL LYMPHOCYTE: 55.4 %
WBC mixed population: 320 cells/uL (ref 200–950)
WBC: 3.6 10*3/uL — AB (ref 3.8–10.8)

## 2017-03-31 LAB — COMPLETE METABOLIC PANEL WITH GFR
AG RATIO: 1.4 (calc) (ref 1.0–2.5)
ALT: 13 U/L (ref 9–46)
AST: 17 U/L (ref 10–35)
Albumin: 4 g/dL (ref 3.6–5.1)
Alkaline phosphatase (APISO): 91 U/L (ref 40–115)
BILIRUBIN TOTAL: 0.5 mg/dL (ref 0.2–1.2)
BUN: 18 mg/dL (ref 7–25)
CHLORIDE: 107 mmol/L (ref 98–110)
CO2: 27 mmol/L (ref 20–32)
Calcium: 9.2 mg/dL (ref 8.6–10.3)
Creat: 1.12 mg/dL (ref 0.70–1.25)
GFR, Est African American: 80 mL/min/{1.73_m2} (ref >=60)
GFR, Est Non African American: 69 mL/min/{1.73_m2} (ref >=60)
Globulin: 2.8 g/dL (calc) (ref 1.9–3.7)
Glucose, Bld: 152 mg/dL — ABNORMAL HIGH (ref 65–99)
POTASSIUM: 4.5 mmol/L (ref 3.5–5.3)
SODIUM: 142 mmol/L (ref 135–146)
Total Protein: 6.8 g/dL (ref 6.1–8.1)

## 2017-03-31 LAB — TEST AUTHORIZATION

## 2017-03-31 LAB — MAGNESIUM: Magnesium: 2 mg/dL (ref 1.5–2.5)

## 2017-03-31 LAB — HEMOGLOBIN A1C W/OUT EAG: Hgb A1c MFr Bld: 5.3 % of total Hgb (ref <5.7)

## 2017-04-13 ENCOUNTER — Other Ambulatory Visit: Payer: Self-pay | Admitting: Family Medicine

## 2017-05-19 ENCOUNTER — Other Ambulatory Visit: Payer: Self-pay | Admitting: Family Medicine

## 2017-05-19 DIAGNOSIS — R413 Other amnesia: Secondary | ICD-10-CM

## 2017-05-29 ENCOUNTER — Other Ambulatory Visit: Payer: Self-pay | Admitting: Family Medicine

## 2017-07-21 ENCOUNTER — Ambulatory Visit: Payer: BLUE CROSS/BLUE SHIELD | Admitting: Family Medicine

## 2017-08-05 ENCOUNTER — Encounter: Payer: Self-pay | Admitting: Family Medicine

## 2017-08-16 ENCOUNTER — Ambulatory Visit: Payer: BLUE CROSS/BLUE SHIELD

## 2017-08-16 VITALS — BP 128/88

## 2017-08-16 DIAGNOSIS — R030 Elevated blood-pressure reading, without diagnosis of hypertension: Secondary | ICD-10-CM

## 2017-08-16 NOTE — Progress Notes (Signed)
Patient walked in today with c/o elevated blood pressure and requested that his blood pressure be checked. Checked patient's blood pressure and his reading was 128/88 sitting at rest. Patient states that he had not actually checked his blood pressure but he felt it was elevated. Advised patient that we could get him an appointment [facility]. Patient accepted appointment stating that he needed the latest appointment tomorrow at 4 pm. Advised patient that if he has any symptoms of SOB, chest pain and elevated BP over the next 24 hours to go to the ER. Patient verbalized understanding.

## 2017-08-17 ENCOUNTER — Encounter: Payer: Self-pay | Admitting: Family Medicine

## 2017-08-17 ENCOUNTER — Ambulatory Visit: Payer: Medicare PPO | Admitting: Family Medicine

## 2017-08-17 ENCOUNTER — Other Ambulatory Visit: Payer: Self-pay

## 2017-08-17 VITALS — Temp 98.2°F | Resp 16 | Ht 68.0 in | Wt 198.1 lb

## 2017-08-17 DIAGNOSIS — R002 Palpitations: Secondary | ICD-10-CM

## 2017-08-17 DIAGNOSIS — R42 Dizziness and giddiness: Secondary | ICD-10-CM | POA: Diagnosis not present

## 2017-08-17 DIAGNOSIS — R011 Cardiac murmur, unspecified: Secondary | ICD-10-CM

## 2017-08-17 NOTE — Patient Instructions (Signed)
Will have cardiology work up for possible murmur and to eval palpitations.   Go to the ER or call 911 with any palpitations with passing out episodes, chest pain, shortness of breath, sweats, nausea jaw or arm pain.    Palpitations A palpitation is the feeling that your heartbeat is irregular or is faster than normal. It may feel like your heart is fluttering or skipping a beat. Palpitations are usually not a serious problem. They may be caused by many things, including smoking, caffeine, alcohol, stress, and certain medicines. Although most causes of palpitations are not serious, palpitations can be a sign of a serious medical problem. In some cases, you may need further medical evaluation. Follow these instructions at home: Pay attention to any changes in your symptoms. Take these actions to help with your condition:  Avoid the following: ? Caffeinated coffee, tea, soft drinks, diet pills, and energy drinks. ? Chocolate. ? Alcohol.  Do not use any tobacco products, such as cigarettes, chewing tobacco, and e-cigarettes. If you need help quitting, ask your health care provider.  Try to reduce your stress and anxiety. Things that can help you relax include: ? Yoga. ? Meditation. ? Physical activity, such as swimming, jogging, or walking. ? Biofeedback. This is a method that helps you learn to use your mind to control things in your body, such as your heartbeats.  Get plenty of rest and sleep.  Take over-the-counter and prescription medicines only as told by your health care provider.  Keep all follow-up visits as told by your health care provider. This is important.  Contact a health care provider if:  You continue to have a fast or irregular heartbeat after 24 hours.  Your palpitations occur more often. Get help right away if:  You have chest pain or shortness of breath.  You have a severe headache.  You feel dizzy or you faint. This information is not intended to replace  advice given to you by your health care provider. Make sure you discuss any questions you have with your health care provider. Document Released: 01/31/2000 Document Revised: 07/08/2015 Document Reviewed: 10/18/2014 Elsevier Interactive Patient Education  [employer]2018 [employer]

## 2017-08-17 NOTE — Progress Notes (Signed)
Patient ID: Cody [name], male    DOB: 1950-03-29, 67 y.o.   MRN: [id number]  PCP: Donita Brooks, MD  Chief Complaint  Patient presents with  . Hypertension    Patient has c/o some dizziness    Subjective:   Cody [name] is a 67 y.o. male, presents to clinic with CC of dizziness yesterday that resolved.  Walked into clinic    Cody [name] is a 67 y.o. male, presents to clinic with CC of dizziness and elevated blood pressure at home, he had walked into clinic yesterday with his symptoms and was checked by triage nurse, blood pressure was noted to be 128/88, no other vitals were taken.  Patient does believe that his home blood pressure machine was faulty.  Patient reports today that he had had 2 days of symptoms which included lightheadedness, palpitations, and they were worse when laying down at rest, with mild associated shortness of breath.  He denies orthopnea and states when he lays down he just "falls asleep" but has no PND or known shortness of breath when laying flat.  States that walking and exertion make his symptoms feel better and he has no exertional near syncope, chest pain, dyspnea or worsening palpitations.  He denied any blacking out episodes, chest pain, pressure, nausea, diaphoresis.  He has been trying to lose weight, states he has been eating and drinking very well, including drinking clear fluids.  He is concerned with possible diabetes.  He is not having any polyuria polydipsia nocturia.  No unintentional weight loss and no known weight gain.  No lower extremity edema, no back pain, abdominal pain, numbness, tingling, headaches, pallor.   Hx of CVA found on MRI 2017, does have histories of migraines and atypical migraines and some memory issues.  He was given Lipitor by his PCP for plaque stabilization status post CVA, but he could not tolerate this it gave him severe muscle pain.  He takes a baby aspirin intermittently.  He has been given  Aricept in the past and has not taken it either but he asks for a refill today stating his wife wants him to take it. He states he is always had normal blood pressure, never had diabetes.   Office visit February 27, 2013 for similar symptoms of near syncope and rapid heart rate, 48-hour Holter monitor  showed suspected PAC's and PVC's and no concerning arrhythmias.     Patient Active Problem List   Diagnosis Date Noted  . Prediabetes   . CVA (cerebral infarction)   . Acute URI 11/08/2012  . Migraine headache 08/05/2010  . Leukopenia 08/05/2010     Prior to Admission medications   Medication Sig Start Date End Date Taking? Authorizing Provider  aspirin EC 81 MG tablet Take 81 mg by mouth daily.   Yes [provider]  donepezil (ARICEPT) 5 MG tablet TAKE 1 TABLET (5 MG TOTAL) BY MOUTH AT BEDTIME. 01/29/17  Yes Donita Brooks, MD  fluticasone (FLONASE) 50 MCG/ACT nasal spray SPRAY 2 SPRAYS INTO EACH NOSTRIL EVERY DAY 04/13/17  Yes Donita Brooks, MD  atorvastatin (LIPITOR) 20 MG tablet TAKE 1 TABLET BY MOUTH EVERY DAY Patient not taking: Reported on 08/17/2017 05/31/17   Donita Brooks, MD     Allergies  Allergen Reactions  . Codeine     Generalized itching      History reviewed. No pertinent family history.   Social History   Socioeconomic History  . Marital status:  Married    Spouse name: Not on file  . Number of children: Not on file  . Years of education: Not on file  . Highest education level: Not on file  Occupational History  . Not on file  Social Needs  . Financial resource strain: Not on file  . Food insecurity:    Worry: Not on file    Inability: Not on file  . Transportation needs:    Medical: Not on file    Non-medical: Not on file  Tobacco Use  . Smoking status: Never Smoker  . Smokeless tobacco: Never Used  Substance and Sexual Activity  . Alcohol use: No  . Drug use: No  . Sexual activity: Not on file  Lifestyle  . Physical  activity:    Days per week: Not on file    Minutes per session: Not on file  . Stress: Not on file  Relationships  . Social connections:    Talks on phone: Not on file    Gets together: Not on file    Attends religious service: Not on file    Active member of club or organization: Not on file    Attends meetings of clubs or organizations: Not on file    Relationship status: Not on file  . Intimate partner violence:    Fear of current or ex partner: Not on file    Emotionally abused: Not on file    Physically abused: Not on file    Forced sexual activity: Not on file  Other Topics Concern  . Not on file  Social History Narrative  . Not on file     Review of Systems  Constitutional: Negative.   HENT: Negative.   Eyes: Negative.   Respiratory: Negative.   Cardiovascular: Negative.   Gastrointestinal: Negative.   Endocrine: Negative.   Genitourinary: Negative.   Musculoskeletal: Negative.   Skin: Negative.   Allergic/Immunologic: Negative.   Neurological: Negative.   Hematological: Negative.   Psychiatric/Behavioral: Negative.   All other systems reviewed and are negative.      Objective:    Vitals:   08/17/17 1548  Resp: 16  Temp: 98.2 F (36.8 C)  TempSrc: Oral  SpO2: 96%  Weight: 198 lb 2 oz (89.9 kg)  Height: 5\' 8"  (1.727 m)     08/17/17 3:48 PM  08/17/17 3:50 PM  08/17/17 3:51 PM     BP  116/78  120/80  110/70   BP Location  RightArm  LeftArm  LeftArm   Patient Position  Supine  Sitting  Standing   Cuff Size  Normal  Normal  Normal   Pulse  59  77  63   Resp  16     Temp  98.52F(36.8C)     Temp src  Oral     SpO2  96%     Weight  198lb2oz(89.9kg)     Height  5'8"(1.76975m)            Physical Exam  Physical Exam  Constitutional: He is oriented to person, place, and time. He appears well-developed and well-nourished. Non-toxic appearance. He does not appear ill. No distress.  HENT:  Head: Normocephalic and  atraumatic.  Right Ear: Tympanic membrane, external ear and ear canal normal.  Left Ear: Tympanic membrane, external ear and ear canal normal.  Nose: No mucosal edema or rhinorrhea. Right sinus exhibits no maxillary sinus tenderness and no frontal sinus tenderness. Left sinus exhibits no maxillary sinus tenderness and no frontal sinus  tenderness.  Mouth/Throat: Uvula is midline. No trismus in the jaw. No uvula swelling. No oropharyngeal exudate, posterior oropharyngeal edema or posterior oropharyngeal erythema.  Eyes: Pupils are equal, round, and reactive to light. Conjunctivae, EOM and lids are normal.  Neck: Trachea normal, normal range of motion and phonation normal. Neck supple. No tracheal deviation present.  Cardiovascular: Regular rhythm, bradycardic normal heart sounds and normal pulses. Exam reveals no gallop and no friction rub.  Pulses:  Radial pulses are 2+ on the right side, and 2+ on the left side.  Posterior tibial pulses are 2+ on the right side, and 2+ on the left side.  Faint systolic murmur auscultated at LLSB   Pulmonary/Chest: Effort normal and breath sounds normal. He has no wheezes. He has no rhonchi. He has no rales.  Abdominal: Soft. Normal appearance and bowel sounds are normal. He exhibits no distension. There is no tenderness. There is no rebound and no guarding.  Musculoskeletal: Normal range of motion. He exhibits no edema.  Neurological: He is alert and oriented to person, place, and time. Gait normal.  Skin: Skin is warm, dry and intact. Capillary refill takes less than 2 seconds. No rash noted. He is not diaphoretic.  Psychiatric: He has a normal mood and affect. His speech is normal and behavior is normal.  Nursing note and vitals reviewed.   EKG: Sinus bradycardia with non-specific t waves, good R wave progression, normal axis, no hypertrophy   Orthostatics negative       Assessment & Plan:      ICD-10-CM   1. Lightheaded R42 CBC with Differential     COMPLETE METABOLIC PANEL WITH GFR    EKG 12-Lead    ECHOCARDIOGRAM COMPLETE    Holter monitor - 48 hour  2. Palpitations R00.2 CBC with Differential    COMPLETE METABOLIC PANEL WITH GFR    EKG 12-Lead    ECHOCARDIOGRAM COMPLETE    Holter monitor - 48 hour    TSH    CANCELED: Ambulatory referral to Cardiology  3. Murmur R01.1 EKG 12-Lead    ECHOCARDIOGRAM COMPLETE    CANCELED: Ambulatory referral to Cardiology     67 year old African-American male with history of bradycardia, migraines, remote CVA and some dementia presents with 2 to 3 days of palpitations, lightheadedness and some very mild associated shortness of breath, symptoms occurred for 2 straight days, were more pronounced when laying down at rest and improved when he was walking around.  Home blood pressure machine showed significantly elevated blood pressures and he walked into clinic yesterday with reassuring blood pressures done by triage nurse.  Appointment made today to reevaluate.  His lightheadedness is completely resolved and he is not currently having any palpitations or shortness of breath.  He is denying any orthopnea, PND, syncope, lower extremity edema, chest pain or pressure.  He does not believe he is dehydrated at all and has had no other concerning or associated symptoms.  On exam patient has regular rhythm bradycardic, suspect very faint systolic murmur heard best in left lower sternal border without radiation, otherwise exam unremarkable.  EKG sinus bradycardia without ischemic changes or premature beats.  Orthostatics were negative with blood pressure ranging 110-120 systolic today heart rate remained in the 50s to 70s.  Will obtain basic labs, Holter monitor and echo to evaluate.  Discussed with Dr. Tanya Nones who will see patient for follow-up.  Danelle Berry, PA-C 08/17/17 4:20 PM

## 2017-08-18 LAB — CBC WITH DIFFERENTIAL/PLATELET
BASOS ABS: 41 {cells}/uL (ref 0–200)
Basophils Relative: 1.2 %
EOS ABS: 71 {cells}/uL (ref 15–500)
Eosinophils Relative: 2.1 %
HCT: 38.7 % (ref 38.5–50.0)
HEMOGLOBIN: 13.1 g/dL — AB (ref 13.2–17.1)
Lymphs Abs: 2060 cells/uL (ref 850–3900)
MCH: 30.8 pg (ref 27.0–33.0)
MCHC: 33.9 g/dL (ref 32.0–36.0)
MCV: 91.1 fL (ref 80.0–100.0)
MPV: 11.2 fL (ref 7.5–12.5)
Monocytes Relative: 9.6 %
NEUTROS PCT: 26.5 %
Neutro Abs: 901 cells/uL — ABNORMAL LOW (ref 1500–7800)
Platelets: 182 10*3/uL (ref 140–400)
RBC: 4.25 10*6/uL (ref 4.20–5.80)
RDW: 12.1 % (ref 11.0–15.0)
Total Lymphocyte: 60.6 %
WBC: 3.4 10*3/uL — ABNORMAL LOW (ref 3.8–10.8)
WBCMIX: 326 {cells}/uL (ref 200–950)

## 2017-08-18 LAB — COMPLETE METABOLIC PANEL WITH GFR
AG RATIO: 1.8 (calc) (ref 1.0–2.5)
ALT: 11 U/L (ref 9–46)
AST: 14 U/L (ref 10–35)
Albumin: 4.2 g/dL (ref 3.6–5.1)
Alkaline phosphatase (APISO): 82 U/L (ref 40–115)
BILIRUBIN TOTAL: 0.7 mg/dL (ref 0.2–1.2)
BUN: 16 mg/dL (ref 7–25)
CO2: 27 mmol/L (ref 20–32)
CREATININE: 1.24 mg/dL (ref 0.70–1.25)
Calcium: 9.4 mg/dL (ref 8.6–10.3)
Chloride: 103 mmol/L (ref 98–110)
GFR, Est African American: 70 mL/min/{1.73_m2} (ref >=60)
GFR, Est Non African American: 61 mL/min/{1.73_m2} (ref >=60)
GLOBULIN: 2.4 g/dL (ref 1.9–3.7)
Glucose, Bld: 127 mg/dL — ABNORMAL HIGH (ref 65–99)
POTASSIUM: 4.3 mmol/L (ref 3.5–5.3)
SODIUM: 138 mmol/L (ref 135–146)
Total Protein: 6.6 g/dL (ref 6.1–8.1)

## 2017-08-19 NOTE — Progress Notes (Signed)
Please notify pt Basic labs okay, similar to his past labs (very mild/borderline anemia)  Good kidney function.  Needs to set up holter moniter (48 hours) and do ECHO and f/up with Dr. Tanya NonesPickard.  Thanks

## 2017-08-20 NOTE — Progress Notes (Signed)
LMTRC

## 2017-08-23 ENCOUNTER — Other Ambulatory Visit: Payer: Self-pay

## 2017-08-23 ENCOUNTER — Ambulatory Visit (HOSPITAL_COMMUNITY): Payer: Medicare PPO | Attending: Cardiology

## 2017-08-23 DIAGNOSIS — I340 Nonrheumatic mitral (valve) insufficiency: Secondary | ICD-10-CM | POA: Diagnosis not present

## 2017-08-23 DIAGNOSIS — I493 Ventricular premature depolarization: Secondary | ICD-10-CM | POA: Diagnosis not present

## 2017-08-23 DIAGNOSIS — R002 Palpitations: Secondary | ICD-10-CM | POA: Diagnosis not present

## 2017-08-23 DIAGNOSIS — R42 Dizziness and giddiness: Secondary | ICD-10-CM | POA: Insufficient documentation

## 2017-08-23 DIAGNOSIS — R011 Cardiac murmur, unspecified: Secondary | ICD-10-CM | POA: Diagnosis not present

## 2017-08-25 NOTE — Progress Notes (Signed)
Pt called and apt made for Monday 08/30/17

## 2017-08-30 ENCOUNTER — Ambulatory Visit (INDEPENDENT_AMBULATORY_CARE_PROVIDER_SITE_OTHER): Payer: Medicare PPO | Admitting: Family Medicine

## 2017-08-30 VITALS — BP 138/84 | HR 70 | Temp 98.0°F | Resp 14 | Wt 198.0 lb

## 2017-08-30 DIAGNOSIS — R002 Palpitations: Secondary | ICD-10-CM | POA: Diagnosis not present

## 2017-08-30 NOTE — Progress Notes (Signed)
Subjective:    Patient ID: Cody [name], male    DOB: [birth year], 67 y.o.   MRN: [id number]  HPI  Patient recently saw my partner for palpitations.  He describes his palpitations as a skipped beat.  He also describes him as a flutter.  He states that his heart will be beating normally and he will feel his heart skip and pause and then resume normal beat.  Flutter last a brief second and then resolve spontaneously.  He had similar symptoms in the past in 2015 and at that time I schedule the patient for a Holter monitor which revealed PVCs and PACs that were rare.  I reviewed his EKG from his last visit which showed sinus bradycardia but normal intervals and a normal axis with no evidence of ischemia or infarction.  I reviewed his echocardiogram which showed an ejection fraction of 55 to 60%, moderate left ventricular wall hypertrophy, mild mitral regurgitation but otherwise normal.  Patient is here today to discuss his studies. Past Medical History:  Diagnosis Date  . Allergy   . Bradycardia   . CVA (cerebral infarction)   . Leukopenia   . Migraine headache   . Prediabetes    Past Surgical History:  Procedure Laterality Date  . SHOULDER SURGERY     right   Current Outpatient Medications on File Prior to Visit  Medication Sig Dispense Refill  . aspirin EC 81 MG tablet Take 81 mg by mouth daily.    . fluticasone (FLONASE) 50 MCG/ACT nasal spray SPRAY 2 SPRAYS INTO EACH NOSTRIL EVERY DAY 48 g 1  . donepezil (ARICEPT) 5 MG tablet TAKE 1 TABLET (5 MG TOTAL) BY MOUTH AT BEDTIME. (Patient not taking: Reported on 08/30/2017) 30 tablet 3   No current facility-administered medications on file prior to visit.    Allergies  Allergen Reactions  . Codeine     Generalized itching    Social History   Socioeconomic History  . Marital status: Married    Spouse name: Not on file  . Number of children: Not on file  . Years of education: Not on file  . Highest education level: Not on  file  Occupational History  . Not on file  Social Needs  . Financial resource strain: Not on file  . Food insecurity:    Worry: Not on file    Inability: Not on file  . Transportation needs:    Medical: Not on file    Non-medical: Not on file  Tobacco Use  . Smoking status: Never Smoker  . Smokeless tobacco: Never Used  Substance and Sexual Activity  . Alcohol use: No  . Drug use: No  . Sexual activity: Not on file  Lifestyle  . Physical activity:    Days per week: Not on file    Minutes per session: Not on file  . Stress: Not on file  Relationships  . Social connections:    Talks on phone: Not on file    Gets together: Not on file    Attends religious service: Not on file    Active member of club or organization: Not on file    Attends meetings of clubs or organizations: Not on file    Relationship status: Not on file  . Intimate partner violence:    Fear of current or ex partner: Not on file    Emotionally abused: Not on file    Physically abused: Not on file    Forced sexual activity: Not  on file  Other Topics Concern  . Not on file  Social History Narrative  . Not on file     Review of Systems  All other systems reviewed and are negative.      Objective:   Physical Exam  Neck: No JVD present.  Cardiovascular: Normal rate and regular rhythm. Exam reveals no gallop and no friction rub.  Murmur heard. Pulmonary/Chest: Effort normal and breath sounds normal. No stridor. No respiratory distress. He has no wheezes.  Musculoskeletal: He exhibits no edema.  Vitals reviewed.         Assessment & Plan:  Palpitations  Physical exam today is completely normal.  EKG and echocardiogram are reassuring.  I suspect the patient having occasional PVCs.  Otherwise he is asymptomatic without chest pain shortness of breath or syncope.  I try to reassure the patient as best I could today.  If symptoms worsen, would recommend a Holter monitor versus an event monitor  depending upon frequency.  Patient was reassured by my reassurance and the results of his echocardiogram and defers a repeat Holter monitor at the present time

## 2017-11-11 DIAGNOSIS — Z23 Encounter for immunization: Secondary | ICD-10-CM | POA: Diagnosis not present

## 2017-11-15 ENCOUNTER — Encounter: Payer: Self-pay | Admitting: Physician Assistant

## 2017-11-15 ENCOUNTER — Ambulatory Visit (INDEPENDENT_AMBULATORY_CARE_PROVIDER_SITE_OTHER): Payer: Medicare PPO | Admitting: Physician Assistant

## 2017-11-15 VITALS — BP 124/80 | HR 60 | Temp 98.0°F | Resp 16 | Wt 196.2 lb

## 2017-11-15 DIAGNOSIS — B9689 Other specified bacterial agents as the cause of diseases classified elsewhere: Secondary | ICD-10-CM

## 2017-11-15 DIAGNOSIS — J988 Other specified respiratory disorders: Secondary | ICD-10-CM

## 2017-11-15 MED ORDER — AZITHROMYCIN 250 MG PO TABS: ORAL_TABLET | ORAL | 0 refills | Status: DC

## 2017-11-15 MED ORDER — CETIRIZINE HCL 10 MG PO TABS: 10.0000 mg | ORAL_TABLET | Freq: Every day | ORAL | 0 refills | Status: DC

## 2017-11-15 NOTE — Progress Notes (Signed)
    Patient ID: Cody [name] MRN: [id number], DOB: 03/06/1950, 67 y.o. Date of Encounter: 11/15/2017, 3:02 PM    Chief Complaint:  Chief Complaint  Patient presents with  . Cough  . eyes itching  . Nasal Congestion     HPI: 67 y.o. year old male presents with above.   He presents with the above. States that the biggest thing is he is been having cough.  States that his wife says that it is "a bark".  Says that it occurs more at night.  States that his eyes have felt a little bit itchy.  Says that they occasionally are watery but there has been no mucus or crust.  Reports that he has had no runny nose.  No mucus from the nose.  He will occasionally have some episodes of sneezing.  States he does have a history of problems with allergies in the past.  He is not sure whether the symptoms seem to be similar to past allergies or not.  Has been using some Mucinex.  No other treatment.  Has had no fevers or chills.  No sore throat.     Home Meds:   Outpatient Medications Prior to Visit  Medication Sig Dispense Refill  . aspirin EC 81 MG tablet Take 81 mg by mouth daily.    Marland Kitchen donepezil (ARICEPT) 5 MG tablet TAKE 1 TABLET (5 MG TOTAL) BY MOUTH AT BEDTIME. (Patient not taking: Reported on 11/15/2017) 30 tablet 3  . fluticasone (FLONASE) 50 MCG/ACT nasal spray SPRAY 2 SPRAYS INTO EACH NOSTRIL EVERY DAY (Patient not taking: Reported on 11/15/2017) 48 g 1   No facility-administered medications prior to visit.     Allergies:  Allergies  Allergen Reactions  . Codeine     Generalized itching       Review of Systems: See HPI for pertinent ROS. All other ROS negative.    Physical Exam: Blood pressure 124/80, pulse 60, temperature 98 F (36.7 C), temperature source Oral, resp. rate 16, weight 89 kg, SpO2 98 %., Body mass index is 29.83 kg/m. General:  WNWD AAM. Appears in no acute distress. HEENT: Normocephalic, atraumatic, eyes without discharge, sclera non-icteric,  nares are without discharge. Bilateral auditory canals clear, TM's are without perforation, pearly grey and translucent with reflective cone of light bilaterally. Oral cavity moist, posterior pharynx without exudate, erythema.  Eyes appear normal.  Conjunctiva show no injection.  No mucus.  Sinuses have no tenderness with percussion.  Neck: Supple. No thyromegaly. No lymphadenopathy. Lungs: Clear bilaterally to auscultation without wheezes, rales, or rhonchi. Breathing is unlabored. Heart: Regular rhythm. No murmurs, rubs, or gallops. Msk:  Strength and tone normal for age. Extremities/Skin: Warm and dry.  Neuro: Alert and oriented X 3. Moves all extremities spontaneously. Gait is normal. CNII-XII grossly in tact. Psych:  Responds to questions appropriately with a normal affect.     ASSESSMENT AND PLAN:  67 y.o. year old male with  1. Bacterial respiratory infection He is to take antibiotic as directed.  Also will have him use some Zyrtec in case there is any allergy component as well.  Follow-up if needed. - azithromycin (ZITHROMAX) 250 MG tablet; Day 1: Take 2 daily.  Days 2 - 5: Take 1 daily.  Dispense: 6 tablet; Refill: 0 - cetirizine (ZYRTEC ALLERGY) 10 MG tablet; Take 1 tablet (10 mg total) by mouth daily.  Dispense: 30 tablet; Refill: 0   Signed, [address] [place], [place], [facility] 11/15/2017 3:02 PM

## 2017-12-08 ENCOUNTER — Other Ambulatory Visit: Payer: Self-pay | Admitting: Physician Assistant

## 2017-12-08 DIAGNOSIS — B9689 Other specified bacterial agents as the cause of diseases classified elsewhere: Principal | ICD-10-CM

## 2017-12-08 DIAGNOSIS — J988 Other specified respiratory disorders: Secondary | ICD-10-CM

## 2017-12-18 LAB — GLUCOSE, POCT (MANUAL RESULT ENTRY): POC GLUCOSE: 119 mg/dL — AB (ref 70–99)

## 2018-01-10 ENCOUNTER — Other Ambulatory Visit: Payer: Self-pay | Admitting: Family Medicine

## 2018-01-10 DIAGNOSIS — J988 Other specified respiratory disorders: Principal | ICD-10-CM

## 2018-01-10 DIAGNOSIS — B9689 Other specified bacterial agents as the cause of diseases classified elsewhere: Secondary | ICD-10-CM

## 2018-02-25 DIAGNOSIS — Z23 Encounter for immunization: Secondary | ICD-10-CM | POA: Diagnosis not present

## 2018-03-08 ENCOUNTER — Ambulatory Visit: Payer: Medicare PPO | Admitting: Family Medicine

## 2018-03-08 ENCOUNTER — Encounter: Payer: Self-pay | Admitting: Family Medicine

## 2018-03-08 VITALS — Temp 97.8°F | Resp 15 | Ht 68.0 in | Wt 195.2 lb

## 2018-03-08 DIAGNOSIS — R002 Palpitations: Secondary | ICD-10-CM | POA: Diagnosis not present

## 2018-03-08 DIAGNOSIS — I10 Essential (primary) hypertension: Secondary | ICD-10-CM | POA: Diagnosis not present

## 2018-03-08 DIAGNOSIS — R030 Elevated blood-pressure reading, without diagnosis of hypertension: Secondary | ICD-10-CM

## 2018-03-08 DIAGNOSIS — R42 Dizziness and giddiness: Secondary | ICD-10-CM

## 2018-03-08 DIAGNOSIS — Z789 Other specified health status: Secondary | ICD-10-CM | POA: Diagnosis not present

## 2018-03-08 HISTORY — DX: Other specified health status: Z78.9

## 2018-03-08 MED ORDER — LOSARTAN POTASSIUM 25 MG PO TABS: 25.0000 mg | ORAL_TABLET | Freq: Every day | ORAL | 0 refills | Status: DC

## 2018-03-08 NOTE — Progress Notes (Signed)
Patient ID: Cody [name], male    DOB: 02-Dec-1950, 68 y.o.   MRN: [id number]  PCP: Donita BrooksPickard, Warren T, MD  Chief Complaint  Patient presents with  . Dizziness    In with c/o lightheaded feeling. States occurs periodically     Subjective:   Cody [name] is a 68 y.o. male, presents to clinic with CC of 2 weeks of coming and going lightheadedness/dizziness.   He says it has happened every couple of days for the last couple of weeks, when it occurs usually after he has bent over stood up, sensation of lightheadedness and dizziness is described as feeling funny he states that he does not feel like he is going to pass out but he has some associated unclear vision for seconds and sometimes palpitations, it only lasts a few seconds.  He has no other associated symptoms with the episodes, no chest pain, back pain, vomiting, nausea, diaphoresis, focal weakness numbness, facial droop, slurred speech, confusion. Dizziness  This is a recurrent problem. The current episode started 1 to 4 weeks ago. The problem occurs intermittently. The problem has been unchanged. Pertinent negatives include no abdominal pain, arthralgias, chest pain, chills, congestion, coughing, diaphoresis, fatigue, fever, headaches, myalgias, nausea, numbness, rash, urinary symptoms, visual change, vomiting or weakness. The symptoms are aggravated by bending. He has tried nothing for the symptoms. The treatment provided no relief.    Have worked up his vague lightheadedness/dizziness and palpitations a few times before.  Patient confirms that he does have a history of stroke or "mini stroke".  He is intolerant of statins and says that he forgets to take his baby aspirin, he currently does not take any of his other medications that are noted on the chart.   He was recently worked up for similar symptoms with Holter monitor and echo which showed occasional PACs and PVCs, echocardiogram showed left ventricle moderate  concentric hypertrophy with normal systolic function and LVEF 55-60%, trivial PR, mild MR.  This several times I have seen him more that he has come in to be triage he has complained of similar symptoms being lightheaded or dizzy his blood pressure has always been normal, today it is elevated.  He denies any headaches, lower extremity edema, exertional dyspnea.     Patient Active Problem List   Diagnosis Date Noted  . Prediabetes   . CVA (cerebral infarction)   . Acute URI 11/08/2012  . Migraine headache 08/05/2010  . Leukopenia 08/05/2010     Prior to Admission medications   Medication Sig Start Date End Date Taking? Authorizing Provider  fluticasone (FLONASE) 50 MCG/ACT nasal spray SPRAY 2 SPRAYS INTO EACH NOSTRIL EVERY DAY 04/13/17  Yes Donita BrooksPickard, Warren T, MD  aspirin EC 81 MG tablet Take 81 mg by mouth daily.    [provider]  cetirizine (ZYRTEC) 10 MG tablet TAKE 1 TABLET BY MOUTH EVERY DAY Patient not taking: Reported on 03/08/2018 01/10/18   Donita BrooksPickard, Warren T, MD     Allergies  Allergen Reactions  . Codeine     Generalized itching      No family history on file.   Social History   Socioeconomic History  . Marital status: Married    Spouse name: Not on file  . Number of children: Not on file  . Years of education: Not on file  . Highest education level: Not on file  Occupational History  . Not on file  Social Needs  . Financial resource strain: Not  on file  . Food insecurity:    Worry: Not on file    Inability: Not on file  . Transportation needs:    Medical: Not on file    Non-medical: Not on file  Tobacco Use  . Smoking status: Never Smoker  . Smokeless tobacco: Never Used  Substance and Sexual Activity  . Alcohol use: No  . Drug use: No  . Sexual activity: Not on file  Lifestyle  . Physical activity:    Days per week: Not on file    Minutes per session: Not on file  . Stress: Not on file  Relationships  . Social connections:    Talks  on phone: Not on file    Gets together: Not on file    Attends religious service: Not on file    Active member of club or organization: Not on file    Attends meetings of clubs or organizations: Not on file    Relationship status: Not on file  . Intimate partner violence:    Fear of current or ex partner: Not on file    Emotionally abused: Not on file    Physically abused: Not on file    Forced sexual activity: Not on file  Other Topics Concern  . Not on file  Social History Narrative  . Not on file     Review of Systems  Constitutional: Negative.  Negative for activity change, appetite change, chills, diaphoresis, fatigue, fever and unexpected weight change.  HENT: Negative.  Negative for congestion.   Eyes: Negative.   Respiratory: Negative.  Negative for cough.   Cardiovascular: Negative.  Negative for chest pain.  Gastrointestinal: Negative.  Negative for abdominal distention, abdominal pain, anal bleeding, blood in stool, constipation, diarrhea, nausea and vomiting.  Endocrine: Negative.  Negative for cold intolerance and heat intolerance.  Genitourinary: Negative.   Musculoskeletal: Negative.  Negative for arthralgias, back pain, gait problem and myalgias.  Skin: Negative.  Negative for color change, pallor and rash.  Allergic/Immunologic: Negative.   Neurological: Positive for dizziness and light-headedness. Negative for tremors, seizures, syncope, facial asymmetry, speech difficulty, weakness, numbness and headaches.  Hematological: Negative.  Negative for adenopathy. Does not bruise/bleed easily.  Psychiatric/Behavioral: Negative.   All other systems reviewed and are negative.      Objective:     Vitals:   03/08/18 1540  Resp: 15  Temp: 97.8 F (36.6 C)  TempSrc: Oral  SpO2: 96%  Weight: 195 lb 4 oz (88.6 kg)  Height: 5\' 8"  (1.727 m)  BP 150/90, HR 56-73  Physical Exam Constitutional:      General: He is not in acute distress.    Appearance: Normal  appearance. He is well-developed. He is not ill-appearing, toxic-appearing or diaphoretic.  HENT:     Head: Normocephalic and atraumatic.     Jaw: No trismus.     Right Ear: Tympanic membrane, ear canal and external ear normal.     Left Ear: Tympanic membrane, ear canal and external ear normal.     Nose: Nose normal. No mucosal edema or rhinorrhea.     Right Sinus: No maxillary sinus tenderness or frontal sinus tenderness.     Left Sinus: No maxillary sinus tenderness or frontal sinus tenderness.     Mouth/Throat:     Mouth: Mucous membranes are moist.     Pharynx: Uvula midline. No oropharyngeal exudate, posterior oropharyngeal erythema or uvula swelling.  Eyes:     General: Lids are normal.  Conjunctiva/sclera: Conjunctivae normal.     Pupils: Pupils are equal, round, and reactive to light.  Neck:     Musculoskeletal: Normal range of motion and neck supple. No neck rigidity.     Vascular: No carotid bruit.     Trachea: Trachea and phonation normal. No tracheal deviation.  Cardiovascular:     Rate and Rhythm: Regular rhythm. Bradycardia present.     Pulses: Normal pulses.          Radial pulses are 2+ on the right side and 2+ on the left side.       Posterior tibial pulses are 2+ on the right side and 2+ on the left side.     Heart sounds: Normal heart sounds. No murmur. No friction rub. No gallop.   Pulmonary:     Effort: Pulmonary effort is normal. No respiratory distress.     Breath sounds: Normal breath sounds. No wheezing, rhonchi or rales.  Abdominal:     General: Bowel sounds are normal. There is no distension.     Palpations: Abdomen is soft.     Tenderness: There is no abdominal tenderness. There is no guarding or rebound.  Musculoskeletal: Normal range of motion.     Right lower leg: No edema.     Left lower leg: No edema.  Lymphadenopathy:     Cervical: No cervical adenopathy.  Skin:    General: Skin is warm and dry.     Capillary Refill: Capillary refill takes  less than 2 seconds.     Findings: No rash.  Neurological:     General: No focal deficit present.     Mental Status: He is alert and oriented to person, place, and time.     Cranial Nerves: Cranial nerves are intact. No cranial nerve deficit, dysarthria or facial asymmetry.     Sensory: Sensation is intact. No sensory deficit.     Motor: Motor function is intact. No weakness, tremor, abnormal muscle tone or pronator drift.     Coordination: Coordination is intact. Romberg sign negative. Coordination normal. Finger-Nose-Finger Test normal.     Gait: Gait is intact. Gait normal.  Psychiatric:        Speech: Speech normal.        Behavior: Behavior normal.      Orthostatics - reviewed and negative     Assessment & Plan:      ICD-10-CM   1. Palpitations R00.2 BASIC METABOLIC PANEL WITH GFR    CBC with Differential    Ambulatory referral to Cardiology  2. Lightheaded R42 BASIC METABOLIC PANEL WITH GFR    CBC with Differential    Ambulatory referral to Cardiology  3. Elevated BP without diagnosis of hypertension R03.0 BASIC METABOLIC PANEL WITH GFR    CBC with Differential    losartan (COZAAR) 25 MG tablet  4. Statin intolerance Z78.9   5. Hypertension, unspecified type I10 Ambulatory referral to Cardiology    Patient with same symptoms as in the past however this time he has elevated blood pressure, I checked it several times in the room so did the nurse continues to be elevated in both arms systolic 142-152, much higher than his normal.  Possible TIA hx, but not on meds for it, he reported to me severe intolerance of statins and just forgetful with medicines encouraged him to take his baby aspirin daily would like to start him on a lower dose blood pressure medication, basic labs to check renal function make sure he does not  have any anemia or cause for his symptoms I do not feel like we need to repeat any of the cardiac work-up but since he continues to have the same complaints we  have done everything we can do some primary care I do think it appropriate to refer him to cardiology for further work-up explained this to him they may be able to do other types of monitoring or stress test.   There was no neuro deficit was not concerned for any stroke - sx I believe are most related to positional changes when he bends over and stands up, not really consistent with BPPV -think we need to treat his blood pressure have him be careful when going from sitting bending or crouching down to standing make sure he gives himself a few seconds to adjust, reassured him this is fairly common and not very concerning if it lasts only a few seconds.  Follow up with PCP or me for BP recheck in 2 weeks - BP log given   Danelle Berry, PA-C 03/08/18 3:49 PM

## 2018-03-08 NOTE — Patient Instructions (Signed)
Monitor your blood pressure - keep track and follow up  Low salt diet - see below  If your dizziness or lightheadedness changes - please follow up   Dizziness Dizziness is a common problem. It makes you feel unsteady or light-headed. You may feel like you are about to pass out (faint). Dizziness can lead to getting hurt if you stumble or fall. Dizziness can be caused by many things, including:  Medicines.  Not having enough water in your body (dehydration).  Illness. Follow these instructions at home: Eating and drinking   Drink enough fluid to keep your pee (urine) clear or pale yellow. This helps to keep you from getting dehydrated. Try to drink more clear fluids, such as water.  Do not drink alcohol.  Limit how much caffeine you drink or eat, if your doctor tells you to do that.  Limit how much salt (sodium) you drink or eat, if your doctor tells you to do that. Activity   Avoid making quick movements. ? When you stand up from sitting in a chair, steady yourself until you feel okay. ? In the morning, first sit up on the side of the bed. When you feel okay, stand slowly while you hold onto something. Do this until you know that your balance is fine.  If you need to stand in one place for a long time, move your legs often. Tighten and relax the muscles in your legs while you are standing.  Do not drive or use heavy machinery if you feel dizzy.  Avoid bending down if you feel dizzy. Place items in your home so you can reach them easily without leaning over. Lifestyle  Do not use any products that contain nicotine or tobacco, such as cigarettes and e-cigarettes. If you need help quitting, ask your doctor.  Try to lower your stress level. You can do this by using methods such as yoga or meditation. Talk with your doctor if you need help. General instructions  Watch your dizziness for any changes.  Take over-the-counter and prescription medicines only as told by your  doctor. Talk with your doctor if you think that you are dizzy because of a medicine that you are taking.  Tell a friend or a family member that you are feeling dizzy. If he or she notices any changes in your behavior, have this person call your doctor.  Keep all follow-up visits as told by your doctor. This is important. Contact a doctor if:  Your dizziness does not go away.  Your dizziness or light-headedness gets worse.  You feel sick to your stomach (nauseous).  You have trouble hearing.  You have new symptoms.  You are unsteady on your feet.  You feel like the room is spinning. Get help right away if:  You throw up (vomit) or have watery poop (diarrhea), and you cannot eat or drink anything.  You have trouble: ? Talking. ? Walking. ? Swallowing. ? Using your arms, hands, or legs.  You feel generally weak.  You are not thinking clearly, or you have trouble forming sentences. A friend or family member may notice this.  You have: ? Chest pain. ? Pain in your belly (abdomen). ? Shortness of breath. ? Sweating.  Your vision changes.  You are bleeding.  You have a very bad headache.  You have neck pain or a stiff neck.  You have a fever. These symptoms may be an emergency. Do not wait to see if the symptoms will go away. Get  medical help right away. Call your local emergency services (911 in the [place] Do not drive yourself to the hospital. Summary  Dizziness makes you feel unsteady or light-headed. You may feel like you are about to pass out (faint).  Drink enough fluid to keep your pee (urine) clear or pale yellow. Do not drink alcohol.  Avoid making quick movements if you feel dizzy.  Watch your dizziness for any changes. This information is not intended to replace advice given to you by your health care provider. Make sure you discuss any questions you have with your health care provider. Document Released: 01/22/2011 Document Revised: 02/20/2016 Document  Reviewed: 02/20/2016 Elsevier Interactive Patient Education  2019 Elsevier Inc.   DASH Eating Plan DASH stands for "Dietary Approaches to Stop Hypertension." The DASH eating plan is a healthy eating plan that has been shown to reduce high blood pressure (hypertension). It may also reduce your risk for type 2 diabetes, heart disease, and stroke. The DASH eating plan may also help with weight loss. What are tips for following this plan?  General guidelines  Avoid eating more than 2,300 mg (milligrams) of salt (sodium) a day. If you have hypertension, you may need to reduce your sodium intake to 1,500 mg a day.  Limit alcohol intake to no more than 1 drink a day for nonpregnant women and 2 drinks a day for men. One drink equals 12 oz of beer, 5 oz of wine, or 1 oz of hard liquor.  Work with your health care provider to maintain a healthy body weight or to lose weight. Ask what an ideal weight is for you.  Get at least 30 minutes of exercise that causes your heart to beat faster (aerobic exercise) most days of the week. Activities may include walking, swimming, or biking.  Work with your health care provider or diet and nutrition specialist (dietitian) to adjust your eating plan to your individual calorie needs. Reading food labels   Check food labels for the amount of sodium per serving. Choose foods with less than 5 percent of the Daily Value of sodium. Generally, foods with less than 300 mg of sodium per serving fit into this eating plan.  To find whole grains, look for the word "whole" as the first word in the ingredient list. Shopping  Buy products labeled as "low-sodium" or "no salt added."  Buy fresh foods. Avoid canned foods and premade or frozen meals. Cooking  Avoid adding salt when cooking. Use salt-free seasonings or herbs instead of table salt or sea salt. Check with your health care provider or pharmacist before using salt substitutes.  Do not fry foods. Cook foods using  healthy methods such as baking, boiling, grilling, and broiling instead.  Cook with heart-healthy oils, such as olive, canola, soybean, or sunflower oil. Meal planning  Eat a balanced diet that includes: ? 5 or more servings of fruits and vegetables each day. At each meal, try to fill half of your plate with fruits and vegetables. ? Up to 6-8 servings of whole grains each day. ? Less than 6 oz of lean meat, poultry, or fish each day. A 3-oz serving of meat is about the same size as a deck of cards. One egg equals 1 oz. ? 2 servings of low-fat dairy each day. ? A serving of nuts, seeds, or beans 5 times each week. ? Heart-healthy fats. Healthy fats called Omega-3 fatty acids are found in foods such as flaxseeds and coldwater fish, like sardines, salmon, and  mackerel.  Limit how much you eat of the following: ? Canned or prepackaged foods. ? Food that is high in trans fat, such as fried foods. ? Food that is high in saturated fat, such as fatty meat. ? Sweets, desserts, sugary drinks, and other foods with added sugar. ? Full-fat dairy products.  Do not salt foods before eating.  Try to eat at least 2 vegetarian meals each week.  Eat more home-cooked food and less restaurant, buffet, and fast food.  When eating at a restaurant, ask that your food be prepared with less salt or no salt, if possible. What foods are recommended? The items listed may not be a complete list. Talk with your dietitian about what dietary choices are best for you. Grains Whole-grain or whole-wheat bread. Whole-grain or whole-wheat pasta. Brown rice. Orpah Cobb. Bulgur. Whole-grain and low-sodium cereals. Pita bread. Low-fat, low-sodium crackers. Whole-wheat flour tortillas. Vegetables Fresh or frozen vegetables (raw, steamed, roasted, or grilled). Low-sodium or reduced-sodium tomato and vegetable juice. Low-sodium or reduced-sodium tomato sauce and tomato paste. Low-sodium or reduced-sodium canned  vegetables. Fruits All fresh, dried, or frozen fruit. Canned fruit in natural juice (without added sugar). Meat and other protein foods Skinless chicken or [place]. Ground chicken or [place]. Pork with fat trimmed off. Fish and seafood. Egg whites. Dried beans, peas, or lentils. Unsalted nuts, nut butters, and seeds. Unsalted canned beans. Lean cuts of beef with fat trimmed off. Low-sodium, lean deli meat. Dairy Low-fat (1%) or fat-free (skim) milk. Fat-free, low-fat, or reduced-fat cheeses. Nonfat, low-sodium ricotta or cottage cheese. Low-fat or nonfat yogurt. Low-fat, low-sodium cheese. Fats and oils Soft margarine without trans fats. Vegetable oil. Low-fat, reduced-fat, or light mayonnaise and salad dressings (reduced-sodium). Canola, safflower, olive, soybean, and sunflower oils. Avocado. Seasoning and other foods Herbs. Spices. Seasoning mixes without salt. Unsalted popcorn and pretzels. Fat-free sweets. What foods are not recommended? The items listed may not be a complete list. Talk with your dietitian about what dietary choices are best for you. Grains Baked goods made with fat, such as croissants, muffins, or some breads. Dry pasta or rice meal packs. Vegetables Creamed or fried vegetables. Vegetables in a cheese sauce. Regular canned vegetables (not low-sodium or reduced-sodium). Regular canned tomato sauce and paste (not low-sodium or reduced-sodium). Regular tomato and vegetable juice (not low-sodium or reduced-sodium). Rosita Fire. Olives. Fruits Canned fruit in a light or heavy syrup. Fried fruit. Fruit in cream or butter sauce. Meat and other protein foods Fatty cuts of meat. Ribs. Fried meat. Tomasa Blase. Sausage. Bologna and other processed lunch meats. Salami. Fatback. Hotdogs. Bratwurst. Salted nuts and seeds. Canned beans with added salt. Canned or smoked fish. Whole eggs or egg yolks. Chicken or [place] with skin. Dairy Whole or 2% milk, cream, and half-and-half. Whole or full-fat  cream cheese. Whole-fat or sweetened yogurt. Full-fat cheese. Nondairy creamers. Whipped toppings. Processed cheese and cheese spreads. Fats and oils Butter. Stick margarine. Lard. Shortening. Ghee. Bacon fat. Tropical oils, such as coconut, palm kernel, or palm oil. Seasoning and other foods Salted popcorn and pretzels. Onion salt, garlic salt, seasoned salt, table salt, and sea salt. Worcestershire sauce. Tartar sauce. Barbecue sauce. Teriyaki sauce. Soy sauce, including reduced-sodium. Steak sauce. Canned and packaged gravies. Fish sauce. Oyster sauce. Cocktail sauce. Horseradish that you find on the shelf. Ketchup. Mustard. Meat flavorings and tenderizers. Bouillon cubes. Hot sauce and Tabasco sauce. Premade or packaged marinades. Premade or packaged taco seasonings. Relishes. Regular salad dressings. Where to find more information:  National Heart, Lung,  and Blood Institute: PopSteam.iswww.nhlbi.nih.gov  American Heart Association: www.heart.org Summary  The DASH eating plan is a healthy eating plan that has been shown to reduce high blood pressure (hypertension). It may also reduce your risk for type 2 diabetes, heart disease, and stroke.  With the DASH eating plan, you should limit salt (sodium) intake to 2,300 mg a day. If you have hypertension, you may need to reduce your sodium intake to 1,500 mg a day.  When on the DASH eating plan, aim to eat more fresh fruits and vegetables, whole grains, lean proteins, low-fat dairy, and heart-healthy fats.  Work with your health care provider or diet and nutrition specialist (dietitian) to adjust your eating plan to your individual calorie needs. This information is not intended to replace advice given to you by your health care provider. Make sure you discuss any questions you have with your health care provider. Document Released: 01/22/2011 Document Revised: 01/27/2016 Document Reviewed: 01/27/2016 Elsevier Interactive Patient Education  2019 [employer]

## 2018-03-09 LAB — CBC WITH DIFFERENTIAL/PLATELET
ABSOLUTE MONOCYTES: 336 {cells}/uL (ref 200–950)
BASOS ABS: 40 {cells}/uL (ref 0–200)
Basophils Relative: 1 %
Eosinophils Absolute: 68 cells/uL (ref 15–500)
Eosinophils Relative: 1.7 %
HEMATOCRIT: 37.4 % — AB (ref 38.5–50.0)
HEMOGLOBIN: 12.5 g/dL — AB (ref 13.2–17.1)
LYMPHS ABS: 2412 {cells}/uL (ref 850–3900)
MCH: 30.4 pg (ref 27.0–33.0)
MCHC: 33.4 g/dL (ref 32.0–36.0)
MCV: 91 fL (ref 80.0–100.0)
MPV: 11.6 fL (ref 7.5–12.5)
Monocytes Relative: 8.4 %
NEUTROS ABS: 1144 {cells}/uL — AB (ref 1500–7800)
Neutrophils Relative %: 28.6 %
Platelets: 174 10*3/uL (ref 140–400)
RBC: 4.11 10*6/uL — AB (ref 4.20–5.80)
RDW: 11.8 % (ref 11.0–15.0)
Total Lymphocyte: 60.3 %
WBC: 4 10*3/uL (ref 3.8–10.8)

## 2018-03-09 LAB — BASIC METABOLIC PANEL WITH GFR
BUN: 17 mg/dL (ref 7–25)
CALCIUM: 9.4 mg/dL (ref 8.6–10.3)
CHLORIDE: 107 mmol/L (ref 98–110)
CO2: 27 mmol/L (ref 20–32)
Creat: 1.22 mg/dL (ref 0.70–1.25)
GFR, EST AFRICAN AMERICAN: 72 mL/min/{1.73_m2} (ref >=60)
GFR, Est Non African American: 62 mL/min/{1.73_m2} (ref >=60)
Glucose, Bld: 82 mg/dL (ref 65–99)
Potassium: 4.1 mmol/L (ref 3.5–5.3)
Sodium: 140 mmol/L (ref 135–146)

## 2018-03-13 NOTE — Progress Notes (Signed)
Cardiology Office Note   Date:  03/15/2018   ID:  Cody Saasnthony E Midgley, DOB 28-Jun-1950, MRN [id number]  PCP:  Donita BrooksPickard, Warren T, MD  Cardiologist:   Charlton HawsPeter Zahava Quant, MD   No chief complaint on file.     History of Present Illness: Cody [name] is a 68 y.o. male who presents for consultation regarding palpitations . Referred by Dr Tanya NonesPickard And Danelle BerryLeisa Tapia PA.  Dizziness for two weeks. Worse with bending over and sitting back up. Vision is blurry Has  A few seconds of palpitations with this. No syncope chest pain or dyspnea. No other TIA signs  Previous w/u for same Symptoms negative with Holter showeing PAC;s/ PVC;s no sustained arrhythmia TTE 08/23/17 with normal EF 55-60% mild MR Not postural and BP usually fine during episode when checked  ? Previous TIA in 2014 , migraines 2012. Does not Take his ASA  All the time and reports being intolerant to statins started on losartan for better BP control Has had an normal MRI brain 09/18/14   Denies chest pain or exertional dyspnea BP improved on cozaar   Past Medical History:  Diagnosis Date  . Acute URI 11/08/2012  . Allergy   . Bradycardia   . CVA (cerebral infarction)   . Leukopenia   . Migraine headache   . Prediabetes   . Statin intolerance 03/08/2018    Past Surgical History:  Procedure Laterality Date  . SHOULDER SURGERY     right     Current Outpatient Medications  Medication Sig Dispense Refill  . aspirin EC 81 MG tablet Take 81 mg by mouth daily.    . cetirizine (ZYRTEC) 10 MG tablet TAKE 1 TABLET BY MOUTH EVERY DAY 90 tablet 3  . fluticasone (FLONASE) 50 MCG/ACT nasal spray SPRAY 2 SPRAYS INTO EACH NOSTRIL EVERY DAY 48 g 1  . losartan (COZAAR) 25 MG tablet Take 1 tablet (25 mg total) by mouth daily. 30 tablet 0   No current facility-administered medications for this visit.     Allergies:   Codeine and Statins    Social History:  The patient  reports that he has never smoked. He has never used smokeless  tobacco. He reports that he does not drink alcohol or use drugs.   Family History:  The patient's family history is not on file.    ROS:  Please see the history of present illness.   Otherwise, review of systems are positive for none.   All other systems are reviewed and negative.    PHYSICAL EXAM: VS:  BP (!) 142/80   Pulse (!) 58   Ht 5\' 8"  (1.727 m)   Wt 191 lb (86.6 kg)   BMI 29.04 kg/m  , BMI Body mass index is 29.04 kg/m. Affect appropriate Healthy:  appears stated age HEENT: normal Neck supple with no adenopathy JVP normal no bruits no thyromegaly Lungs clear with no wheezing and good diaphragmatic motion Heart:  S1/S2 no murmur, no rub, gallop or click PMI normal Abdomen: benighn, BS positve, no tenderness, no AAA no bruit.  No HSM or HJR Distal pulses intact with no bruits No edema Neuro non-focal Skin warm and dry No muscular weakness    EKG:  08/17/17 SR rate 66 normal ECG    Recent Labs: 03/29/2017: Magnesium 2.0 08/17/2017: ALT 11 03/08/2018: BUN 17; Creat 1.22; Hemoglobin 12.5; Platelets 174; Potassium 4.1; Sodium 140    Lipid Panel    Component Value Date/Time   CHOL 194 06/18/2016  0842   TRIG 109 06/18/2016 0842   HDL 76 06/18/2016 0842   CHOLHDL 2.6 06/18/2016 0842   VLDL 22 06/18/2016 0842   LDLCALC 96 06/18/2016 0842      Wt Readings from Last 3 Encounters:  03/15/18 191 lb (86.6 kg)  03/08/18 195 lb 4 oz (88.6 kg)  11/15/17 196 lb 3.2 oz (89 kg)      Other studies Reviewed: Additional studies/ records that were reviewed today include: notes from primary echo holter labs and MRI 2016.    ASSESSMENT AND PLAN:  1.  Dizziness:  Doubt cardiac etiology with benign holter, echo and ECG no need for further testing  2. HTN:  Started on cozaar by primary recently f/u with them 3. TIA:  Distant history on ASA MRI done 2016 did not show abnormality 4. HLD: intolerant to statins only LDL in Epic in 96 range suspect with no documented vascular  disease zetia Or diet Rx sufficient f/u primary    Current medicines are reviewed at length with the patient today.  The patient does not have concerns regarding medicines.  The following changes have been made:  no change  Labs/ tests ordered today include: none  No orders of the defined types were placed in this encounter.    Disposition:   FU with cardiology PRN      Signed, Charlton Haws, MD  03/15/2018 2:26 PM    [facility] [address] [place], [place], [place]  [address] Phone: [phone]; Fax: [phone]

## 2018-03-14 ENCOUNTER — Ambulatory Visit: Payer: Medicare PPO | Admitting: *Deleted

## 2018-03-14 VITALS — BP 148/86

## 2018-03-14 DIAGNOSIS — R030 Elevated blood-pressure reading, without diagnosis of hypertension: Secondary | ICD-10-CM

## 2018-03-15 ENCOUNTER — Ambulatory Visit: Payer: Medicare PPO | Admitting: Cardiovascular Disease

## 2018-03-15 VITALS — BP 142/80 | HR 58 | Ht 68.0 in | Wt 191.0 lb

## 2018-03-15 DIAGNOSIS — I10 Essential (primary) hypertension: Secondary | ICD-10-CM | POA: Diagnosis not present

## 2018-03-15 NOTE — Patient Instructions (Signed)
Medication Instructions:   If you need a refill on your cardiac medications before your next appointment, please call your pharmacy.   Lab work:  If you have labs (blood work) drawn today and your tests are completely normal, you will receive your results only by: . MyChart Message (if you have MyChart) OR . A paper copy in the mail If you have any lab test that is abnormal or we need to change your treatment, we will call you to review the results.  Testing/Procedures: None ordered today.  Follow-Up: At [facility], you and your health needs are our priority.  As part of our continuing mission to provide you with exceptional heart care, we have created designated Provider Care Teams.  These Care Teams include your primary Cardiologist (physician) and Advanced Practice Providers (APPs -  Physician Assistants and Nurse Practitioners) who all work together to provide you with the care you need, when you need it. Your physician recommends that you schedule a follow-up appointment as needed with Dr. Nishan.   

## 2018-03-22 ENCOUNTER — Ambulatory Visit: Payer: Medicare PPO | Admitting: Family Medicine

## 2018-03-22 ENCOUNTER — Encounter: Payer: Self-pay | Admitting: Family Medicine

## 2018-03-22 VITALS — BP 130/84 | HR 57 | Temp 98.1°F | Resp 15 | Ht 68.0 in | Wt 192.2 lb

## 2018-03-22 DIAGNOSIS — I10 Essential (primary) hypertension: Secondary | ICD-10-CM | POA: Diagnosis not present

## 2018-03-22 DIAGNOSIS — Z8673 Personal history of transient ischemic attack (TIA), and cerebral infarction without residual deficits: Secondary | ICD-10-CM | POA: Diagnosis not present

## 2018-03-22 MED ORDER — LOSARTAN POTASSIUM 25 MG PO TABS: 25.0000 mg | ORAL_TABLET | Freq: Every day | ORAL | 2 refills | Status: DC

## 2018-03-22 NOTE — Progress Notes (Signed)
Patient ID: Cody [name], male    DOB: 10-31-1950, 68 y.o.   MRN: [id number]  PCP: Donita BrooksPickard, Warren T, MD  Chief Complaint  Patient presents with  . Hypertension    Patient in today to follow up on hypertension    Subjective:   Cody Saasnthony E Pillars is a 68 y.o. male, presents to clinic with CC of HTN recheck.  He has been taking losartan 25 mg daily in the morning without any known side effects.  He has been keeping track of his blood pressures on a log doing 2 readings a day.  He did get a cuff that goes around his wrist, his readings for the first week from January 22 to January 27 ranged from 130/62 all the way to 190/115.  He did come in and do a nurse visit where they evaluated how he was taking his own blood pressure with his cuff and then the nurse took his blood pressure and he would hold his arms flexed and held tightly up to his chest this was increasing his blood pressure readings at least 20 points systolic and diastolic in the nurse blood pressures on 127 were 144/88 and 148/86.  Of his symptoms have resolved is not having any palpitations weakness, dizzy or lightheaded episodes.  From January 27 to February 4 his readings have ranged from 132/80 -160/99, majority of BP readings SBP 140's, and generally it is improving.  He is doing diet and exercise also to try to improve his blood pressure. He recently saw the neurologist who said he was good.    66 y/o brother - stroke   Patient Active Problem List   Diagnosis Date Noted  . Statin intolerance 03/08/2018  . Prediabetes   . CVA (cerebral infarction)   . Acute URI 11/08/2012  . Migraine headache 08/05/2010  . Leukopenia 08/05/2010    Current Meds  Medication Sig  . aspirin EC 81 MG tablet Take 81 mg by mouth daily.  Marland Kitchen. losartan (COZAAR) 25 MG tablet Take 1 tablet (25 mg total) by mouth daily.     Review of Systems  Constitutional: Negative.   HENT: Negative.   Eyes: Negative.   Respiratory: Negative.   Negative for cough, chest tightness, shortness of breath and wheezing.   Cardiovascular: Negative.  Negative for chest pain, palpitations and leg swelling.  Gastrointestinal: Negative.   Endocrine: Negative.   Genitourinary: Negative.   Musculoskeletal: Negative.   Skin: Negative.   Allergic/Immunologic: Negative.   Neurological: Negative.  Negative for dizziness, syncope, weakness and light-headedness.  Hematological: Negative.   Psychiatric/Behavioral: Negative.   All other systems reviewed and are negative.      Objective:    Vitals:   03/22/18 1553  BP: 130/84  Pulse: (!) 57  Resp: 15  Temp: 98.1 F (36.7 C)  TempSrc: Oral  SpO2: 98%  Weight: 192 lb 4 oz (87.2 kg)  Height: 5\' 8"  (1.727 m)      Physical Exam Vitals signs and nursing note reviewed.  Constitutional:      General: He is not in acute distress.    Appearance: Normal appearance. He is well-developed. He is not ill-appearing, toxic-appearing or diaphoretic.  HENT:     Head: Normocephalic and atraumatic.     Right Ear: External ear normal.     Left Ear: External ear normal.     Nose: Nose normal.     Mouth/Throat:     Mouth: Mucous membranes are moist.  Pharynx: Oropharynx is clear.  Eyes:     General:        Right eye: No discharge.        Left eye: No discharge.     Conjunctiva/sclera: Conjunctivae normal.  Neck:     Musculoskeletal: Normal range of motion.     Trachea: No tracheal deviation.  Cardiovascular:     Rate and Rhythm: Normal rate and regular rhythm.     Pulses: Normal pulses.     Heart sounds: Normal heart sounds. No murmur. No friction rub. No gallop.   Pulmonary:     Effort: Pulmonary effort is normal. No respiratory distress.     Breath sounds: Normal breath sounds. No stridor. No wheezing, rhonchi or rales.  Chest:     Chest wall: No tenderness.  Abdominal:     General: Bowel sounds are normal. There is no distension.  Musculoskeletal: Normal range of motion.     Right  lower leg: No edema.     Left lower leg: No edema.  Skin:    General: Skin is warm and dry.     Capillary Refill: Capillary refill takes less than 2 seconds.     Findings: No rash.  Neurological:     Mental Status: He is alert.     Motor: No abnormal muscle tone.     Coordination: Coordination normal.     Gait: Gait normal.  Psychiatric:        Mood and Affect: Mood normal.        Behavior: Behavior normal.           Assessment & Plan:   Problem List Items Addressed This Visit      Cardiovascular and Mediastinum   Essential hypertension - Primary    2 week BP log brought in, BP's improving, headed towards goal, but most readings still elevated with 25 mg losartan.  No SE. Plan to continue to monitor for 1-2 more weeks If majority of BP's >140/90 will need to increase losartan to 50 mg PO once daily and recheck in 1 month If at or near goal 130/80, continue 25 mg losartan PO daily and recheck in 3 months He will contact us with BP log in about 1-2 weeks to determine f/up Any higher BP f/up sooner.      Relevant Medications   losartan (COZAAR) 25 MG tablet   Other Relevant Orders   COMPLETE METABOLIC PANEL WITH GFR   Lipid panel    Other Visit Diagnoses    H/O: CVA (cerebrovascular accident)       Relevant Orders   COMPLETE METABOLIC PANEL WITH GFR   Lipid panel          Danelle Berry, PA-C 03/22/18 4:10 PM

## 2018-03-22 NOTE — Assessment & Plan Note (Signed)
2 week BP log brought in, BP's improving, headed towards goal, but most readings still elevated with 25 mg losartan.  No SE. Plan to continue to monitor for 1-2 more weeks If majority of BP's >140/90 will need to increase losartan to 50 mg PO once daily and recheck in 1 month If at or near goal 130/80, continue 25 mg losartan PO daily and recheck in 3 months He will contact us with BP log in about 1-2 weeks to determine f/up Any higher BP f/up sooner.

## 2018-03-22 NOTE — Patient Instructions (Addendum)
Keep checking your blood pressure for the next two weeks.   If on average for the next 1-2 weeks your BP readings are greater than >140/90, we need to increase your dose to 50 mg once a day (take two pills once a day - and let me know)   Three month folllow up   If you are changing doses - one month follow up after dose change

## 2018-04-09 ENCOUNTER — Encounter: Payer: Self-pay | Admitting: Family Medicine

## 2018-05-25 ENCOUNTER — Encounter: Payer: Self-pay | Admitting: Family Medicine

## 2018-05-26 ENCOUNTER — Encounter: Payer: Self-pay | Admitting: Family Medicine

## 2018-05-26 ENCOUNTER — Ambulatory Visit (INDEPENDENT_AMBULATORY_CARE_PROVIDER_SITE_OTHER): Payer: Medicare PPO | Admitting: Family Medicine

## 2018-05-26 ENCOUNTER — Other Ambulatory Visit: Payer: Self-pay

## 2018-05-26 DIAGNOSIS — I10 Essential (primary) hypertension: Secondary | ICD-10-CM

## 2018-05-26 MED ORDER — LOSARTAN POTASSIUM 50 MG PO TABS: 50.0000 mg | ORAL_TABLET | Freq: Every day | ORAL | 3 refills | Status: DC

## 2018-05-26 MED ORDER — LOSARTAN POTASSIUM-HCTZ 50-12.5 MG PO TABS: 1.0000 | ORAL_TABLET | ORAL | 1 refills | Status: DC

## 2018-05-26 NOTE — Progress Notes (Signed)
Virtual Visit via Telephone Note  Appt arranged with Elsie Saas for 05/26/18 at 11:45 AM EDT   Called pt briefly to reschedule for later today, pt agrees.  Pt contacted via telephone and verified that I am speaking with the correct person using two identifiers.   Services provided today were via telemedicine through telephone call.  Patient reported their location during encounter was at home  Patient consented to telephone visit  I conducted telephone visit from [facility] (when I called earlier today), and from my home office this evening.  Referring Provider:   Donita Brooks, MD   Start of phone call:  7:17 PM  All participants in encounter:  [facility] [employer]IP[employer] and myself, Danelle Berry, PA-C  I discussed the limitations, risks, security and privacy concerns of performing an evaluation and management service by telephone and the availability of in person appointments. I also discussed with the patient that there may be a patient responsible charge related to this service. The patient expressed understanding and agreed to proceed.   History of Present Illness: HTN - Pt sent in BP log pictures via mychart and wanted to follow up. Last seen 2 months ago, plan was to monitor BP, increase losartan from 25 to 50 mg if average readings over 140/90, and contact our office and f/up if changing meds.  BP log records 04/08/2018 to 05/25/2018.   Large amount of readings from BP log have >140 SBP (majority) and many readings with DBO >90 and >100  Observations/Objective: Limited due to telephone visit Pt alert, speaking in full sentences  Assessment and Plan:  Problem List Items Addressed This Visit      Cardiovascular and Mediastinum   Essential hypertension - Primary    BP still elevated and not at goal with increasing meds to 50 mg losartan No sx of meds, no sx of CP, SOB, syncope, lighheadedness, LE edema Start 50-12.5 losartan- HCTZ,  continue to monitor F/up in 2-4 weeks BP has never been elevated or resistant like this before (that I can see from info in chart), though various visits for vague palpitations/dizziness/near syncope, work ups pretty benign, although last year ECHO did show LVH.  Encouraged pt to f/up with his PCP       Relevant Medications   losartan-hydrochlorothiazide (HYZAAR) 50-12.5 MG tablet         Follow Up Instructions: F/up with PCP in the next few months routinely  F/up in 2-4 weeks for BP recheck   I discussed the assessment and treatment plan with the patient. The patient was provided an opportunity to ask questions and all were answered. The patient agreed with the plan and demonstrated an understanding of the instructions.   The patient was advised to call back or seek an in-person evaluation if the symptoms worsen or if the condition fails to improve as anticipated.  Call concluded 7:25pm  I provided 7 minutes of non-face-to-face time during this encounter.   Danelle Berry, PA-C

## 2018-05-26 NOTE — Telephone Encounter (Signed)
Can you call pt and see if he would be agreeable to phone visit f/up appt ASAP so I can review the readings and meds with him (we were adjusting meds 2 months ago).  Thank you

## 2018-05-28 NOTE — Assessment & Plan Note (Signed)
BP still elevated and not at goal with increasing meds to 50 mg losartan No sx of meds, no sx of CP, SOB, syncope, lighheadedness, LE edema Start 50-12.5 losartan- HCTZ, continue to monitor F/up in 2-4 weeks BP has never been elevated or resistant like this before (that I can see from info in chart), though various visits for vague palpitations/dizziness/near syncope, work ups pretty benign, although last year ECHO did show LVH.  Encouraged pt to f/up with his PCP

## 2018-06-21 ENCOUNTER — Encounter: Payer: Self-pay | Admitting: Family Medicine

## 2018-06-21 ENCOUNTER — Ambulatory Visit (INDEPENDENT_AMBULATORY_CARE_PROVIDER_SITE_OTHER): Payer: Medicare PPO | Admitting: Family Medicine

## 2018-06-21 ENCOUNTER — Ambulatory Visit: Payer: Medicare PPO | Admitting: Family Medicine

## 2018-06-21 ENCOUNTER — Other Ambulatory Visit: Payer: Self-pay

## 2018-06-21 VITALS — BP 118/80 | HR 57 | Temp 98.1°F | Resp 18 | Ht 68.0 in | Wt 191.6 lb

## 2018-06-21 DIAGNOSIS — I10 Essential (primary) hypertension: Secondary | ICD-10-CM

## 2018-06-21 NOTE — Progress Notes (Signed)
Patient ID: Cody [name], male    DOB: 02-23-1950, 68 y.o.   MRN: [id number]  PCP: Danelle Berry, PA-C  Chief Complaint  Patient presents with  . Hypertension    f/u    Subjective:   Cody [name] is a 68 y.o. male, presents to clinic with CC of HTN BP recheck following medication changes.  He brings in several pages of BP monitoring.  He is concerned with variations in his BP.  He has been taking meds as prescribed.  Denies any SE of meds.  Denies HA, near syncope, weakness, fatigue, CP, SOB, orthopnea, LE edema, palpitations.  Although he was instructed many times to f/up with his PCP he continues to come to me for acute and chronic OV.  Discussed this with him and today he asks to change stating that I "have smaller hands than [his PCP]" and he laughs.    Pt also reports that he had Pneumovax at [facility] - but he does not have any documentation and cannot remember the date.   Patient Active Problem List   Diagnosis Date Noted  . Essential hypertension 03/22/2018  . Statin intolerance 03/08/2018  . Prediabetes   . CVA (cerebral infarction)   . Migraine headache 08/05/2010  . Leukopenia 08/05/2010    Current Meds  Medication Sig  . aspirin EC 81 MG tablet Take 81 mg by mouth daily.  . fluticasone (FLONASE) 50 MCG/ACT nasal spray SPRAY 2 SPRAYS INTO EACH NOSTRIL EVERY DAY  . losartan-hydrochlorothiazide (HYZAAR) 50-12.5 MG tablet Take 1 tablet by mouth every morning.     Review of Systems  Constitutional: Negative.   HENT: Negative.   Eyes: Negative.   Respiratory: Negative.   Cardiovascular: Negative.   Gastrointestinal: Negative.   Endocrine: Negative.   Genitourinary: Negative.   Musculoskeletal: Negative.   Skin: Negative.   Allergic/Immunologic: Negative.   Neurological: Negative.   Hematological: Negative.   Psychiatric/Behavioral: Negative.   All other systems reviewed and are negative.      Objective:    Vitals:   06/21/18 1428  BP:  118/80  Pulse: (!) 57  Resp: 18  Temp: 98.1 F (36.7 C)  SpO2: 98%  Weight: 191 lb 9.6 oz (86.9 kg)  Height: 5\' 8"  (1.727 m)      Physical Exam Vitals signs and nursing note reviewed.  Constitutional:      General: He is not in acute distress.    Appearance: He is well-developed and normal weight. He is not ill-appearing, toxic-appearing or diaphoretic.  HENT:     Head: Normocephalic and atraumatic.     Right Ear: External ear normal.     Left Ear: External ear normal.  Eyes:     General: No scleral icterus.       Right eye: No discharge.        Left eye: No discharge.     Conjunctiva/sclera: Conjunctivae normal.     Pupils: Pupils are equal, round, and reactive to light.  Neck:     Trachea: No tracheal deviation.  Cardiovascular:     Rate and Rhythm: Normal rate and regular rhythm.     Pulses: Normal pulses.     Heart sounds: Normal heart sounds. No murmur. No friction rub. No gallop.   Pulmonary:     Effort: Pulmonary effort is normal. No respiratory distress.     Breath sounds: Normal breath sounds. No stridor.  Musculoskeletal: Normal range of motion.     Right lower leg: No  edema.     Left lower leg: No edema.  Skin:    General: Skin is warm and dry.     Findings: No rash.  Neurological:     Mental Status: He is alert.     Motor: No abnormal muscle tone.     Coordination: Coordination normal.  Psychiatric:        Behavior: Behavior normal.           Assessment & Plan:   Problem List Items Addressed This Visit      Cardiovascular and Mediastinum   Essential hypertension - Primary    BP at goal today Pt compliant with meds w/o SE Denies lightheaded episodes, palpitations Continue losartan-HCTZ 50-12.5 daily  Will need MWV/labs in a few months when clinic resumes routine well care.             Danelle BerryLeisa Braedyn Riggle, PA-C 06/21/18 2:17 PM

## 2018-06-22 NOTE — Assessment & Plan Note (Addendum)
BP at goal today Pt compliant with meds w/o SE Denies lightheaded episodes, palpitations Continue losartan-HCTZ 50-12.5 daily  Will need MWV/labs in a few months when clinic resumes routine well care.

## 2018-06-29 ENCOUNTER — Encounter: Payer: Self-pay | Admitting: Family Medicine

## 2018-07-09 ENCOUNTER — Other Ambulatory Visit: Payer: Self-pay | Admitting: Family Medicine

## 2018-07-27 DIAGNOSIS — J309 Allergic rhinitis, unspecified: Secondary | ICD-10-CM | POA: Diagnosis not present

## 2018-07-27 DIAGNOSIS — Z8673 Personal history of transient ischemic attack (TIA), and cerebral infarction without residual deficits: Secondary | ICD-10-CM | POA: Diagnosis not present

## 2018-07-27 DIAGNOSIS — I10 Essential (primary) hypertension: Secondary | ICD-10-CM | POA: Diagnosis not present

## 2018-08-29 ENCOUNTER — Encounter: Payer: Self-pay | Admitting: Family Medicine

## 2018-08-31 ENCOUNTER — Ambulatory Visit (INDEPENDENT_AMBULATORY_CARE_PROVIDER_SITE_OTHER): Payer: Medicare PPO | Admitting: Family Medicine

## 2018-08-31 ENCOUNTER — Other Ambulatory Visit: Payer: Self-pay

## 2018-08-31 ENCOUNTER — Encounter: Payer: Self-pay | Admitting: Family Medicine

## 2018-08-31 VITALS — BP 130/88 | HR 75 | Temp 98.1°F | Resp 18 | Ht 68.0 in | Wt 193.0 lb

## 2018-08-31 DIAGNOSIS — N529 Male erectile dysfunction, unspecified: Secondary | ICD-10-CM | POA: Diagnosis not present

## 2018-08-31 DIAGNOSIS — R002 Palpitations: Secondary | ICD-10-CM

## 2018-08-31 DIAGNOSIS — Z789 Other specified health status: Secondary | ICD-10-CM | POA: Diagnosis not present

## 2018-08-31 DIAGNOSIS — R42 Dizziness and giddiness: Secondary | ICD-10-CM | POA: Diagnosis not present

## 2018-08-31 DIAGNOSIS — I10 Essential (primary) hypertension: Secondary | ICD-10-CM | POA: Diagnosis not present

## 2018-08-31 DIAGNOSIS — R399 Unspecified symptoms and signs involving the genitourinary system: Secondary | ICD-10-CM

## 2018-08-31 MED ORDER — MECLIZINE HCL 12.5 MG PO TABS: 12.5000 mg | ORAL_TABLET | Freq: Three times a day (TID) | ORAL | 0 refills | Status: DC | PRN

## 2018-08-31 NOTE — Progress Notes (Signed)
Patient ID: Cody [name], male    DOB: [birth year], 68 y.o.   MRN: [id number]  PCP: Delsa Grana, PA-C  Chief Complaint  Patient presents with  . Medication Management    Subjective:   Cody [name] is a 68 y.o. male, presents to clinic with CC of dizziness.  He turned his head to the left while he was driving had severe dizziness described as spinning and he had to pull over and waited for it to go away 5-10 min, but he didn't feel normal until 2 hours later.  He has had a few similar episodes lasting a few seconds that have only occurred with quick movements of his head. It did make him anxious, he had racing heart and chest tightness.  He did not have any associated chest pain, diaphoresis, near syncope, shortness of breath.  Does have a history of anxiety and related somatic symptoms which in the past have led to negative work-ups including normal Holter monitor and unremarkable echo (related to palpitations).  HTN - brings in BP log with most blood pressure readings in the 130s he is taking blood pressure in both of his arms and writing it down they are only a few points different from each other but this makes him nervous that they are not the same.    He also very hesitantly mentions some urinary symptoms he has had to strain or he is going to the bathroom frequently, also has some erectile dysfunction or so his wife says.  To him he feels fine but his wife says that he has had decreased libido and erections over the past several months.     Patient Active Problem List   Diagnosis Date Noted  . Essential hypertension 03/22/2018  . Statin intolerance 03/08/2018  . Prediabetes   . CVA (cerebral infarction)   . Migraine headache 08/05/2010  . Leukopenia 08/05/2010    Current Meds  Medication Sig  . aspirin EC 81 MG tablet Take 81 mg by mouth daily.  . fluticasone (FLONASE) 50 MCG/ACT nasal spray SPRAY 2 SPRAYS INTO EACH NOSTRIL EVERY DAY  .  losartan-hydrochlorothiazide (HYZAAR) 50-12.5 MG tablet Take 1 tablet by mouth every morning.     Review of Systems  Constitutional: Negative.   HENT: Negative.   Eyes: Negative.   Respiratory: Negative.   Cardiovascular: Negative.   Gastrointestinal: Negative.   Endocrine: Negative.   Genitourinary: Negative.   Musculoskeletal: Negative.   Skin: Negative.   Allergic/Immunologic: Negative.   Neurological: Negative.   Hematological: Negative.   Psychiatric/Behavioral: Negative.   All other systems reviewed and are negative.      Objective:    Vitals:   08/31/18 1509  BP: 130/88  Pulse: 75  Resp: 18  Temp: 98.1 F (36.7 C)  SpO2: 94%  Weight: 193 lb (87.5 kg)  Height: '5\' 8"'  (1.727 m)      Physical Exam Vitals signs and nursing note reviewed.  Constitutional:      General: He is not in acute distress.    Appearance: Normal appearance. He is well-developed. He is not ill-appearing, toxic-appearing or diaphoretic.  HENT:     Head: Normocephalic and atraumatic.     Jaw: No trismus.     Right Ear: Tympanic membrane, ear canal and external ear normal. There is no impacted cerumen.     Left Ear: Tympanic membrane, ear canal and external ear normal. There is no impacted cerumen.     Nose: Nose normal. No mucosal  edema, congestion or rhinorrhea.     Right Sinus: No maxillary sinus tenderness or frontal sinus tenderness.     Left Sinus: No maxillary sinus tenderness or frontal sinus tenderness.     Mouth/Throat:     Mouth: Mucous membranes are moist.     Pharynx: Oropharynx is clear. Uvula midline. No oropharyngeal exudate, posterior oropharyngeal erythema or uvula swelling.  Eyes:     General: Lids are normal.     Conjunctiva/sclera: Conjunctivae normal.     Pupils: Pupils are equal, round, and reactive to light.  Neck:     Musculoskeletal: Normal range of motion and neck supple.     Trachea: Trachea and phonation normal. No tracheal deviation.  Cardiovascular:      Rate and Rhythm: Normal rate and regular rhythm.     Pulses: Normal pulses.          Radial pulses are 2+ on the right side and 2+ on the left side.       Posterior tibial pulses are 2+ on the right side and 2+ on the left side.     Heart sounds: Normal heart sounds. No murmur. No friction rub. No gallop.   Pulmonary:     Effort: Pulmonary effort is normal.     Breath sounds: Normal breath sounds. No wheezing, rhonchi or rales.  Abdominal:     General: Bowel sounds are normal. There is no distension.     Palpations: Abdomen is soft.     Tenderness: There is no abdominal tenderness. There is no guarding or rebound.  Musculoskeletal: Normal range of motion.     Right lower leg: No edema.     Left lower leg: No edema.  Skin:    General: Skin is warm and dry.     Capillary Refill: Capillary refill takes less than 2 seconds.     Findings: No rash.  Neurological:     General: No focal deficit present.     Mental Status: He is alert and oriented to person, place, and time.     Cranial Nerves: Cranial nerves are intact. No cranial nerve deficit, dysarthria or facial asymmetry.     Sensory: Sensation is intact.     Motor: Motor function is intact. No weakness, tremor, abnormal muscle tone or pronator drift.     Coordination: Coordination is intact. Romberg sign negative. Finger-Nose-Finger Test normal.     Gait: Gait is intact. Gait normal.  Psychiatric:        Attention and Perception: Attention normal.        Mood and Affect: Affect normal. Mood is anxious.        Speech: Speech normal.        Behavior: Behavior normal. Behavior is cooperative.        Cognition and Memory: Cognition normal.           Assessment & Plan:   Problem List Items Addressed This Visit      Cardiovascular and Mediastinum   Essential hypertension    Has labs pending, BP log today shows overall BP near goal, no SE of meds, excellent compliance Continues to have some intermittent palpitations but seems  more related to anxiety, suspect somatic He requests cardiology follow up       Other Visit Diagnoses    Vertigo    -  Primary   Suspect BPPV, trial meclizine, ENT follow up if sx continue or worsen, reproducible with head movement, no neuro deficit on exam   Relevant  Medications   meclizine (ANTIVERT) 12.5 MG tablet   Erectile dysfunction, unspecified erectile dysfunction type       briefly mentioned, check testosterone   Relevant Orders   Testosterone (Completed)   PSA (Completed)   Lower urinary tract symptoms (LUTS)       check PSA, advised would need DRE (refused today)   Relevant Orders   PSA (Completed)      Pt has come in multiple times with concerns with blood pressure fluctuating, with complaints of near syncope or lightheaded episodes has had multiple presentations with sensation of palpitations, today his symptoms are more vertigo but also caused some anxiety and palpitation symptoms again.  He was not due to be seen routinely for his blood pressure today but he brings in his blood pressure log which appears very stable and most readings around 130/80.  He had standing lab work for rechecking a chemistry/ renal function and other routine and fasting labs - will add today labs to other orders.  I did review past ECHO from last year TTE 08/23/2017 pertinent for Normal LVF, moderate LVH, trivial PR, mild MR Cannot find to review the past holter monitor report. I suspect the patient does have some predominant aspect of somatic symptoms secondary to anxiety, but in past Holter monitors from February 27, 2013 there was PACs and PVCs, and echo does show left ventricular hypertrophy and some mild mitral valve regurg, he does have elevated cholesterol and is statin intolerant.  Work-up by cardiology for palpitations, lightheadedness/near syncope may be appropriate since he has years of complaints and many visits since I first met him a year ago.  I did also discuss with the patient that if  his tests and vital signs continue to be reassuring, we may need to address anxiety cause it can cause his sx as well.  Pt wants to see cardiology and verbalized understanding about somatic sx related to anxiety.     Delsa Grana, PA-C 08/31/18 3:14 PM

## 2018-08-31 NOTE — Patient Instructions (Addendum)
Come back as soon as you can for fasting lab work.  You can look up the clinic i'm transfering to and see if you want to continue to have me see you there.  [facility] [address], [address] [place], [place] [address]  Main: [phone]  Call in August to transfer here if you would like   Benign Positional Vertigo Vertigo is the feeling that you or your surroundings are moving when they are not. Benign positional vertigo is the most common form of vertigo. This is usually a harmless condition (benign). This condition is positional. This means that symptoms are triggered by certain movements and positions. This condition can be dangerous if it occurs while you are doing something that could cause harm to you or others. This includes activities such as driving or operating machinery. What are the causes? In many cases, the cause of this condition is not known. It may be caused by a disturbance in an area of the inner ear that helps your brain to sense movement and balance. This disturbance can be caused by:  Viral infection (labyrinthitis).  Head injury.  Repetitive motion, such as jumping, dancing, or running. What increases the risk? You are more likely to develop this condition if:  You are a woman.  You are 66 years of age or older. What are the signs or symptoms? Symptoms of this condition usually happen when you move your head or your eyes in different directions. Symptoms may start suddenly, and usually last for less than a minute. They include:  Loss of balance and falling.  Feeling like you are spinning or moving.  Feeling like your surroundings are spinning or moving.  Nausea and vomiting.  Blurred vision.  Dizziness.  Involuntary eye movement (nystagmus). Symptoms can be mild and cause only minor problems, or they can be severe and interfere with daily life. Episodes of benign positional vertigo may return (recur) over time. Symptoms may  improve over time. How is this diagnosed? This condition may be diagnosed based on:  Your medical history.  Physical exam of the head, neck, and ears.  Tests, such as: ? MRI. ? CT scan. ? Eye movement tests. Your health care provider may ask you to change positions quickly while he or she watches you for symptoms of benign positional vertigo, such as nystagmus. Eye movement may be tested with a variety of exams that are designed to evaluate or stimulate vertigo. ? An electroencephalogram (EEG). This records electrical activity in your brain. ? Hearing tests. You may be referred to a health care provider who specializes in ear, nose, and throat (ENT) problems (otolaryngologist) or a provider who specializes in disorders of the nervous system (neurologist). How is this treated?  This condition may be treated in a session in which your health care provider moves your head in specific positions to adjust your inner ear back to normal. Treatment for this condition may take several sessions. Surgery may be needed in severe cases, but this is rare. In some cases, benign positional vertigo may resolve on its own in 2-4 weeks. Follow these instructions at home: Safety  Move slowly. Avoid sudden body or head movements or certain positions, as told by your health care provider.  Avoid driving until your health care provider says it is safe for you to do so.  Avoid operating heavy machinery until your health care provider says it is safe for you to do so.  Avoid doing any tasks that would be dangerous  to you or others if vertigo occurs.  If you have trouble walking or keeping your balance, try using a cane for stability. If you feel dizzy or unstable, sit down right away.  Return to your normal activities as told by your health care provider. Ask your health care provider what activities are safe for you. General instructions  Take over-the-counter and prescription medicines only as told by  your health care provider.  Drink enough fluid to keep your urine pale yellow.  Keep all follow-up visits as told by your health care provider. This is important. Contact a health care provider if:  You have a fever.  Your condition gets worse or you develop new symptoms.  Your family or friends notice any behavioral changes.  You have nausea or vomiting that gets worse.  You have numbness or a "pins and needles" sensation. Get help right away if you:  Have difficulty speaking or moving.  Are always dizzy.  Faint.  Develop severe headaches.  Have weakness in your legs or arms.  Have changes in your hearing or vision.  Develop a stiff neck.  Develop sensitivity to light. Summary  Vertigo is the feeling that you or your surroundings are moving when they are not. Benign positional vertigo is the most common form of vertigo.  The cause of this condition is not known. It may be caused by a disturbance in an area of the inner ear that helps your brain to sense movement and balance.  Symptoms include loss of balance and falling, feeling that you or your surroundings are moving, nausea and vomiting, and blurred vision.  This condition can be diagnosed based on symptoms, physical exam, and other tests, such as MRI, CT scan, eye movement tests, and hearing tests.  Follow safety instructions as told by your health care provider. You will also be told when to contact your health care provider in case of problems. This information is not intended to replace advice given to you by your health care provider. Make sure you discuss any questions you have with your health care provider. Document Released: 11/10/2005 Document Revised: 07/14/2017 Document Reviewed: 07/14/2017 Elsevier Patient Education  2020 [employer].

## 2018-09-05 ENCOUNTER — Other Ambulatory Visit: Payer: Self-pay

## 2018-09-06 ENCOUNTER — Other Ambulatory Visit: Payer: Medicare PPO

## 2018-09-06 DIAGNOSIS — I10 Essential (primary) hypertension: Secondary | ICD-10-CM

## 2018-09-06 DIAGNOSIS — Z125 Encounter for screening for malignant neoplasm of prostate: Secondary | ICD-10-CM | POA: Diagnosis not present

## 2018-09-06 DIAGNOSIS — E785 Hyperlipidemia, unspecified: Secondary | ICD-10-CM | POA: Diagnosis not present

## 2018-09-06 DIAGNOSIS — N529 Male erectile dysfunction, unspecified: Secondary | ICD-10-CM | POA: Diagnosis not present

## 2018-09-06 DIAGNOSIS — Z8673 Personal history of transient ischemic attack (TIA), and cerebral infarction without residual deficits: Secondary | ICD-10-CM | POA: Diagnosis not present

## 2018-09-06 DIAGNOSIS — R399 Unspecified symptoms and signs involving the genitourinary system: Secondary | ICD-10-CM | POA: Diagnosis not present

## 2018-09-06 DIAGNOSIS — I639 Cerebral infarction, unspecified: Secondary | ICD-10-CM | POA: Diagnosis not present

## 2018-09-07 ENCOUNTER — Encounter: Payer: Self-pay | Admitting: Family Medicine

## 2018-09-07 ENCOUNTER — Other Ambulatory Visit: Payer: Self-pay | Admitting: Family Medicine

## 2018-09-07 DIAGNOSIS — I10 Essential (primary) hypertension: Secondary | ICD-10-CM

## 2018-09-07 DIAGNOSIS — D72819 Decreased white blood cell count, unspecified: Secondary | ICD-10-CM

## 2018-09-07 DIAGNOSIS — E785 Hyperlipidemia, unspecified: Secondary | ICD-10-CM

## 2018-09-07 LAB — COMPLETE METABOLIC PANEL WITH GFR
AG Ratio: 1.5 (calc) (ref 1.0–2.5)
ALT: 11 U/L (ref 9–46)
AST: 15 U/L (ref 10–35)
Albumin: 4 g/dL (ref 3.6–5.1)
Alkaline phosphatase (APISO): 67 U/L (ref 35–144)
BUN: 22 mg/dL (ref 7–25)
CO2: 24 mmol/L (ref 20–32)
Calcium: 9.1 mg/dL (ref 8.6–10.3)
Chloride: 105 mmol/L (ref 98–110)
Creat: 1.23 mg/dL (ref 0.70–1.25)
GFR, Est African American: 70 mL/min/{1.73_m2} (ref >=60)
GFR, Est Non African American: 61 mL/min/{1.73_m2} (ref >=60)
Globulin: 2.6 g/dL (calc) (ref 1.9–3.7)
Glucose, Bld: 97 mg/dL (ref 65–99)
Potassium: 4.7 mmol/L (ref 3.5–5.3)
Sodium: 138 mmol/L (ref 135–146)
Total Bilirubin: 0.8 mg/dL (ref 0.2–1.2)
Total Protein: 6.6 g/dL (ref 6.1–8.1)

## 2018-09-07 LAB — PSA: PSA: 0.9 ng/mL (ref <=4.0)

## 2018-09-07 LAB — LIPID PANEL
Cholesterol: 195 mg/dL (ref <200)
HDL: 56 mg/dL (ref >=40)
LDL Cholesterol (Calc): 117 mg/dL (calc) — ABNORMAL HIGH
Non-HDL Cholesterol (Calc): 139 mg/dL (calc) — ABNORMAL HIGH (ref <130)
Total CHOL/HDL Ratio: 3.5 (calc) (ref <5.0)
Triglycerides: 108 mg/dL (ref <150)

## 2018-09-07 LAB — TESTOSTERONE: Testosterone: 491 ng/dL (ref 250–827)

## 2018-09-07 LAB — EXTRA LAV TOP TUBE

## 2018-09-07 MED ORDER — PRAVASTATIN SODIUM 10 MG PO TABS: 10.0000 mg | ORAL_TABLET | Freq: Every day | ORAL | 1 refills | Status: DC

## 2018-09-07 NOTE — Assessment & Plan Note (Signed)
Has labs pending, BP log today shows overall BP near goal, no SE of meds, excellent compliance Continues to have some intermittent palpitations but seems more related to anxiety, suspect somatic He requests cardiology follow up

## 2018-10-26 ENCOUNTER — Encounter: Payer: Self-pay | Admitting: Family Medicine

## 2018-10-27 ENCOUNTER — Other Ambulatory Visit: Payer: Self-pay

## 2018-11-14 ENCOUNTER — Other Ambulatory Visit: Payer: Self-pay | Admitting: Family Medicine

## 2018-11-14 DIAGNOSIS — I10 Essential (primary) hypertension: Secondary | ICD-10-CM

## 2019-01-18 ENCOUNTER — Other Ambulatory Visit: Payer: Self-pay

## 2019-01-18 DIAGNOSIS — Z20822 Contact with and (suspected) exposure to covid-19: Secondary | ICD-10-CM

## 2019-01-20 ENCOUNTER — Telehealth: Payer: Self-pay

## 2019-01-20 NOTE — Telephone Encounter (Signed)
Pt. Checking on COVID 19 results . Not available yet. 

## 2019-01-21 LAB — NOVEL CORONAVIRUS, NAA: SARS-CoV-2, NAA: DETECTED — AB

## 2019-01-22 ENCOUNTER — Telehealth: Payer: Self-pay | Admitting: Unknown Physician Specialty

## 2019-01-22 ENCOUNTER — Other Ambulatory Visit: Payer: Self-pay | Admitting: Unknown Physician Specialty

## 2019-01-22 DIAGNOSIS — U071 COVID-19: Secondary | ICD-10-CM

## 2019-01-22 NOTE — Telephone Encounter (Signed)
Discussed with patient about Covid symptoms and the use of bamlanivimab, a monoclonal antibody infusion for those with mild to moderate Covid symptoms and at a high risk of hospitalization.  Pt is qualified for this infusion at the [facility] infusion center due to Age > 31 which were addressed with the patient and are actively being managed by a [facility] provider.    After discussing the infusion's costs, potential benefits and side effects, the patient has decided to accept treatment with monoclonal antibodies.

## 2019-01-23 NOTE — Telephone Encounter (Signed)
Scheduled for 12/8 1PM

## 2019-01-24 ENCOUNTER — Ambulatory Visit (HOSPITAL_COMMUNITY)
Admission: RE | Admit: 2019-01-24 | Discharge: 2019-01-24 | Disposition: A | Discharge status: Home / Self Care | Payer: Medicare Other | Source: Ambulatory Visit | Attending: Pulmonary Disease | Admitting: Pulmonary Disease

## 2019-01-24 DIAGNOSIS — U071 COVID-19: Secondary | ICD-10-CM | POA: Diagnosis not present

## 2019-01-24 DIAGNOSIS — Z23 Encounter for immunization: Secondary | ICD-10-CM | POA: Insufficient documentation

## 2019-01-24 MED ORDER — FAMOTIDINE IN NACL 20-0.9 MG/50ML-% IV SOLN: 20.0000 mg | Freq: Once | INTRAVENOUS | Status: DC | PRN

## 2019-01-24 MED ORDER — DIPHENHYDRAMINE HCL 50 MG/ML IJ SOLN: 50.0000 mg | Freq: Once | INTRAMUSCULAR | Status: DC | PRN

## 2019-01-24 MED ORDER — METHYLPREDNISOLONE SODIUM SUCC 125 MG IJ SOLR: 125.0000 mg | Freq: Once | INTRAMUSCULAR | Status: DC | PRN

## 2019-01-24 MED ORDER — SODIUM CHLORIDE 0.9 % IV SOLN
700.0000 mg | Freq: Once | INTRAVENOUS | Status: AC
  Administered 2019-01-24: 700 mg via INTRAVENOUS
  Filled 2019-01-24: qty 20

## 2019-01-24 MED ORDER — ALBUTEROL SULFATE HFA 108 (90 BASE) MCG/ACT IN AERS: 2.0000 | INHALATION_SPRAY | Freq: Once | RESPIRATORY_TRACT | Status: DC | PRN

## 2019-01-24 MED ORDER — SODIUM CHLORIDE 0.9 % IV SOLN: INTRAVENOUS | Status: DC | PRN

## 2019-01-24 MED ORDER — EPINEPHRINE 0.3 MG/0.3ML IJ SOAJ: 0.3000 mg | Freq: Once | INTRAMUSCULAR | Status: DC | PRN

## 2019-01-24 NOTE — Progress Notes (Addendum)
  Diagnosis: COVID-19  Physician: Dr. Wright  Procedure: Covid Infusion Clinic Med: bamlanivimab infusion - Provided patient with bamlanimivab fact sheet for patients, parents and caregivers prior to infusion.  Complications: No immediate complications noted.  Discharge: Discharged home   Roya Gieselman 01/24/2019  

## 2019-01-30 ENCOUNTER — Other Ambulatory Visit: Payer: Self-pay

## 2019-01-30 DIAGNOSIS — Z20822 Contact with and (suspected) exposure to covid-19: Secondary | ICD-10-CM

## 2019-02-01 LAB — NOVEL CORONAVIRUS, NAA: SARS-CoV-2, NAA: NOT DETECTED

## 2019-02-02 ENCOUNTER — Ambulatory Visit: Payer: Medicare PPO | Admitting: Family Medicine

## 2019-03-02 ENCOUNTER — Ambulatory Visit: Payer: Medicare PPO | Admitting: Family Medicine

## 2019-03-06 ENCOUNTER — Encounter: Payer: Self-pay | Admitting: Family Medicine

## 2019-03-06 ENCOUNTER — Other Ambulatory Visit: Payer: Self-pay

## 2019-03-06 ENCOUNTER — Ambulatory Visit (INDEPENDENT_AMBULATORY_CARE_PROVIDER_SITE_OTHER): Payer: Medicare HMO | Admitting: Family Medicine

## 2019-03-06 VITALS — BP 120/68 | HR 76 | Temp 96.6°F | Resp 16 | Ht 68.0 in | Wt 193.0 lb

## 2019-03-06 DIAGNOSIS — Z8673 Personal history of transient ischemic attack (TIA), and cerebral infarction without residual deficits: Secondary | ICD-10-CM | POA: Diagnosis not present

## 2019-03-06 DIAGNOSIS — Z125 Encounter for screening for malignant neoplasm of prostate: Secondary | ICD-10-CM | POA: Diagnosis not present

## 2019-03-06 DIAGNOSIS — Z0000 Encounter for general adult medical examination without abnormal findings: Secondary | ICD-10-CM | POA: Diagnosis not present

## 2019-03-06 DIAGNOSIS — E785 Hyperlipidemia, unspecified: Secondary | ICD-10-CM | POA: Diagnosis not present

## 2019-03-06 DIAGNOSIS — Z23 Encounter for immunization: Secondary | ICD-10-CM

## 2019-03-06 DIAGNOSIS — I10 Essential (primary) hypertension: Secondary | ICD-10-CM

## 2019-03-06 LAB — CBC WITH DIFFERENTIAL/PLATELET
Absolute Monocytes: 329 cells/uL (ref 200–950)
Basophils Absolute: 32 cells/uL (ref 0–200)
Basophils Relative: 0.9 %
Eosinophils Absolute: 60 cells/uL (ref 15–500)
Eosinophils Relative: 1.7 %
HCT: 36.5 % — ABNORMAL LOW (ref 38.5–50.0)
Hemoglobin: 12.2 g/dL — ABNORMAL LOW (ref 13.2–17.1)
Lymphs Abs: 1834 cells/uL (ref 850–3900)
MCH: 31.5 pg (ref 27.0–33.0)
MCHC: 33.4 g/dL (ref 32.0–36.0)
MCV: 94.3 fL (ref 80.0–100.0)
MPV: 11.3 fL (ref 7.5–12.5)
Monocytes Relative: 9.4 %
Neutro Abs: 1246 cells/uL — ABNORMAL LOW (ref 1500–7800)
Neutrophils Relative %: 35.6 %
Platelets: 193 10*3/uL (ref 140–400)
RBC: 3.87 10*6/uL — ABNORMAL LOW (ref 4.20–5.80)
RDW: 12.7 % (ref 11.0–15.0)
Total Lymphocyte: 52.4 %
WBC: 3.5 10*3/uL — ABNORMAL LOW (ref 3.8–10.8)

## 2019-03-06 LAB — COMPLETE METABOLIC PANEL WITH GFR
AG Ratio: 1.5 (calc) (ref 1.0–2.5)
ALT: 10 U/L (ref 9–46)
AST: 15 U/L (ref 10–35)
Albumin: 4 g/dL (ref 3.6–5.1)
Alkaline phosphatase (APISO): 63 U/L (ref 35–144)
BUN/Creatinine Ratio: 20 (calc) (ref 6–22)
BUN: 26 mg/dL — ABNORMAL HIGH (ref 7–25)
CO2: 28 mmol/L (ref 20–32)
Calcium: 9.6 mg/dL (ref 8.6–10.3)
Chloride: 102 mmol/L (ref 98–110)
Creat: 1.27 mg/dL — ABNORMAL HIGH (ref 0.70–1.25)
GFR, Est African American: 68 mL/min/{1.73_m2} (ref >=60)
GFR, Est Non African American: 58 mL/min/{1.73_m2} — ABNORMAL LOW (ref >=60)
Globulin: 2.6 g/dL (calc) (ref 1.9–3.7)
Glucose, Bld: 112 mg/dL — ABNORMAL HIGH (ref 65–99)
Potassium: 4.1 mmol/L (ref 3.5–5.3)
Sodium: 139 mmol/L (ref 135–146)
Total Bilirubin: 0.5 mg/dL (ref 0.2–1.2)
Total Protein: 6.6 g/dL (ref 6.1–8.1)

## 2019-03-06 LAB — LIPID PANEL
Cholesterol: 154 mg/dL (ref <200)
HDL: 54 mg/dL (ref >=40)
LDL Cholesterol (Calc): 73 mg/dL (calc)
Non-HDL Cholesterol (Calc): 100 mg/dL (calc) (ref <130)
Total CHOL/HDL Ratio: 2.9 (calc) (ref <5.0)
Triglycerides: 167 mg/dL — ABNORMAL HIGH (ref <150)

## 2019-03-06 MED ORDER — SILDENAFIL CITRATE 100 MG PO TABS: 50.0000 mg | ORAL_TABLET | Freq: Every day | ORAL | 11 refills | Status: DC | PRN

## 2019-03-06 MED ORDER — PRAVASTATIN SODIUM 10 MG PO TABS: 10.0000 mg | ORAL_TABLET | Freq: Every day | ORAL | 3 refills | Status: DC

## 2019-03-06 NOTE — Addendum Note (Signed)
Addended by: Legrand Rams B on: 03/06/2019 05:06 PM   Modules accepted: Orders

## 2019-03-06 NOTE — Progress Notes (Signed)
Subjective:    Patient ID: Cody [name], male    DOB: 09/12/1950, 69 y.o.   MRN: [id number]  HPI 08/2017 Patient recently saw my partner for palpitations.  He describes his palpitations as a skipped beat.  He also describes him as a flutter.  He states that his heart will be beating normally and he will feel his heart skip and pause and then resume normal beat.  Flutter last a brief second and then resolve spontaneously.  He had similar symptoms in the past in 2015 and at that time I schedule the patient for a Holter monitor which revealed PVCs and PACs that were rare.  I reviewed his EKG from his last visit which showed sinus bradycardia but normal intervals and a normal axis with no evidence of ischemia or infarction.  I reviewed his echocardiogram which showed an ejection fraction of 55 to 60%, moderate left ventricular wall hypertrophy, mild mitral regurgitation but otherwise normal.  Patient is here today to discuss his studies.  At that time, my plan was: Physical exam today is completely normal.  EKG and echocardiogram are reassuring.  I suspect the patient having occasional PVCs.  Otherwise he is asymptomatic without chest pain shortness of breath or syncope.  I try to reassure the patient as best I could today.  If symptoms worsen, would recommend a Holter monitor versus an event monitor depending upon frequency.  Patient was reassured by my reassurance and the results of his echocardiogram and defers a repeat Holter monitor at the present time  03/06/19 Here for CPE.  Since I last saw the patient, he contracted COVID-19.  However he has recovered completely.  He denies any shortness of breath.  His lung sounds excellent today.  There is no wheezes crackles or rails.  Otherwise he is doing well with no concerns.  He does report erectile dysfunction.  This is been more of an issue over the last 3 to 4 months.  He denies any psychological issues contributing to it.  The relationship is  good.  It is difficult to obtain an erection or maintain an erection.  Otherwise he is doing well.  He states that he has good days and bad days but he attributes that to the current situation in society with all the violence and protesting and pain.  He denies clinical depression.  He denies falls or memory loss.  He is due for Pneumovax 23.  The remainder of his immunizations are up-to-date.  His last colonoscopy was in 2014 and is good until 2024.  He had his PSA checked in July 2020 so that is not due yet. Past Medical History:  Diagnosis Date  . Acute URI 11/08/2012  . Allergy   . Bradycardia   . CVA (cerebral infarction)   . Leukopenia   . Migraine headache   . Prediabetes   . Statin intolerance 03/08/2018   Past Surgical History:  Procedure Laterality Date  . SHOULDER SURGERY     right   Current Outpatient Medications on File Prior to Visit  Medication Sig Dispense Refill  . fluticasone (FLONASE) 50 MCG/ACT nasal spray SPRAY 2 SPRAYS INTO EACH NOSTRIL EVERY DAY 48 g 3  . losartan-hydrochlorothiazide (HYZAAR) 50-12.5 MG tablet TAKE 1 TABLET BY MOUTH EVERY DAY IN THE MORNING 90 tablet 1  . pravastatin (PRAVACHOL) 10 MG tablet Take 1 tablet (10 mg total) by mouth daily. 90 tablet 1   No current facility-administered medications on file prior to visit.  Allergies  Allergen Reactions  . Codeine     Generalized itching   . Statins    Social History   Socioeconomic History  . Marital status: Married    Spouse name: Not on file  . Number of children: Not on file  . Years of education: Not on file  . Highest education level: Not on file  Occupational History  . Not on file  Tobacco Use  . Smoking status: Never Smoker  . Smokeless tobacco: Never Used  Substance and Sexual Activity  . Alcohol use: No  . Drug use: No  . Sexual activity: Not on file  Other Topics Concern  . Not on file  Social History Narrative  . Not on file   Social Determinants of Health    Financial Resource Strain:   . Difficulty of Paying Living Expenses: Not on file  Food Insecurity:   . Worried About Charity fundraiser in the Last Year: Not on file  . Ran Out of Food in the Last Year: Not on file  Transportation Needs:   . Lack of Transportation (Medical): Not on file  . Lack of Transportation (Non-Medical): Not on file  Physical Activity:   . Days of Exercise per Week: Not on file  . Minutes of Exercise per Session: Not on file  Stress:   . Feeling of Stress : Not on file  Social Connections:   . Frequency of Communication with Friends and Family: Not on file  . Frequency of Social Gatherings with Friends and Family: Not on file  . Attends Religious Services: Not on file  . Active Member of Clubs or Organizations: Not on file  . Attends Archivist Meetings: Not on file  . Marital Status: Not on file  Intimate Partner Violence:   . Fear of Current or Ex-Partner: Not on file  . Emotionally Abused: Not on file  . Physically Abused: Not on file  . Sexually Abused: Not on file     Review of Systems  All other systems reviewed and are negative.      Objective:   Physical Exam Vitals reviewed.  Constitutional:      General: He is not in acute distress.    Appearance: Normal appearance. He is normal weight. He is not ill-appearing, toxic-appearing or diaphoretic.  HENT:     Head: Normocephalic and atraumatic.     Right Ear: Tympanic membrane, ear canal and external ear normal. There is no impacted cerumen.     Left Ear: Tympanic membrane, ear canal and external ear normal. There is no impacted cerumen.     Nose: Nose normal. No congestion or rhinorrhea.     Mouth/Throat:     Mouth: Mucous membranes are moist.     Pharynx: Oropharynx is clear. No oropharyngeal exudate or posterior oropharyngeal erythema.  Eyes:     General: No scleral icterus.       Right eye: No discharge.        Left eye: No discharge.     Extraocular Movements:  Extraocular movements intact.     Conjunctiva/sclera: Conjunctivae normal.     Pupils: Pupils are equal, round, and reactive to light.  Neck:     Vascular: No carotid bruit or JVD.  Cardiovascular:     Rate and Rhythm: Normal rate and regular rhythm.     Heart sounds: Murmur present. No friction rub. No gallop.   Pulmonary:     Effort: Pulmonary effort is normal. No respiratory  distress.     Breath sounds: Normal breath sounds. No stridor. No wheezing, rhonchi or rales.  Chest:     Chest wall: No tenderness.  Abdominal:     General: Abdomen is flat. Bowel sounds are normal. There is no distension.     Palpations: Abdomen is soft. There is no mass.     Tenderness: There is no abdominal tenderness. There is no guarding or rebound.  Musculoskeletal:     Cervical back: Normal range of motion and neck supple.     Right lower leg: No edema.     Left lower leg: No edema.  Lymphadenopathy:     Cervical: No cervical adenopathy.  Skin:    General: Skin is warm.     Coloration: Skin is not jaundiced or pale.     Findings: No bruising, erythema, lesion or rash.  Neurological:     General: No focal deficit present.     Mental Status: He is alert and oriented to person, place, and time. Mental status is at baseline.     Cranial Nerves: No cranial nerve deficit.     Sensory: No sensory deficit.     Motor: No weakness.     Coordination: Coordination normal.     Gait: Gait normal.     Deep Tendon Reflexes: Reflexes normal.  Psychiatric:        Mood and Affect: Mood normal.        Behavior: Behavior normal.        Thought Content: Thought content normal.        Judgment: Judgment normal.           Assessment & Plan:  Essential hypertension - Plan: CBC with Differential/Platelet, COMPLETE METABOLIC PANEL WITH GFR, Lipid panel  Hyperlipidemia, unspecified hyperlipidemia type - Plan: CBC with Differential/Platelet, COMPLETE METABOLIC PANEL WITH GFR, Lipid panel  H/O: CVA  (cerebrovascular accident) - Plan: CBC with Differential/Platelet, COMPLETE METABOLIC PANEL WITH GFR, Lipid panel  General medical exam - Plan: CBC with Differential/Platelet, COMPLETE METABOLIC PANEL WITH GFR, Lipid panel, CANCELED: PSA  Prostate cancer screening - Plan: CANCELED: PSA  Patient's physical exam today is normal.  His blood pressure is outstanding.  He received Pneumovax 23 today.  Colonoscopy is up-to-date.  He will be due for PSA in July 2021.  I will check a CBC, CMP, fasting lipid panel.  Given his history of a CVA, his goal LDL cholesterol is less than 70.  He denies any issues with memory loss or depression.  He denies any falls.  Immunizations are up-to-date aside from Pneumovax 23.  I will give the patient Viagra 50 mg 1 tablet 30 minutes prior to activity for arterial insufficiency/ED

## 2019-05-04 ENCOUNTER — Other Ambulatory Visit: Payer: Self-pay | Admitting: Family Medicine

## 2019-05-04 DIAGNOSIS — I10 Essential (primary) hypertension: Secondary | ICD-10-CM

## 2019-06-01 ENCOUNTER — Ambulatory Visit: Payer: Medicare HMO | Admitting: Family Medicine

## 2019-09-13 ENCOUNTER — Other Ambulatory Visit: Payer: Self-pay

## 2019-09-13 MED ORDER — FLUTICASONE PROPIONATE 50 MCG/ACT NA SUSP: NASAL | 3 refills | Status: DC

## 2019-10-29 ENCOUNTER — Other Ambulatory Visit: Payer: Self-pay | Admitting: Family Medicine

## 2019-10-29 DIAGNOSIS — I10 Essential (primary) hypertension: Secondary | ICD-10-CM

## 2019-11-02 ENCOUNTER — Other Ambulatory Visit: Payer: Self-pay

## 2019-11-02 ENCOUNTER — Ambulatory Visit (INDEPENDENT_AMBULATORY_CARE_PROVIDER_SITE_OTHER): Payer: Medicare HMO | Admitting: Family Medicine

## 2019-11-02 VITALS — BP 98/60 | HR 56 | Temp 98.1°F | Resp 18 | Wt 194.0 lb

## 2019-11-02 DIAGNOSIS — M25562 Pain in left knee: Secondary | ICD-10-CM

## 2019-11-02 MED ORDER — MELOXICAM 15 MG PO TABS: 15.0000 mg | ORAL_TABLET | Freq: Every day | ORAL | 3 refills | Status: DC

## 2019-11-02 NOTE — Progress Notes (Signed)
Subjective:    Patient ID: Cody [name], male    DOB: 1950-11-13, 69 y.o.   MRN: [id number]  HPI 3 months ago, the patient stepped off a curb and lost his balance landing directly on his left knee striking his patella against the pavement.  Pain has gradually improved however he continues to have pain in his left knee.  The pain is primarily located underneath the patella and over the lateral joint line.  He denies any locking or laxity in the joint.  He denies any laxity to varus or valgus stress.  He has a negative anterior and posterior drawer sign.  He has a negative Apley grind.  There is no tenderness to palpation around the knee.  Patient states that he will have stiffness towards the end of the day particularly after sitting for a long period of time.  He also has pain around the kneecap with prolonged standing Past Medical History:  Diagnosis Date  . Acute URI 11/08/2012  . Allergy   . Bradycardia   . CVA (cerebral infarction)   . Leukopenia   . Migraine headache   . Prediabetes   . Statin intolerance 03/08/2018   Past Surgical History:  Procedure Laterality Date  . SHOULDER SURGERY     right   Current Outpatient Medications on File Prior to Visit  Medication Sig Dispense Refill  . fluticasone (FLONASE) 50 MCG/ACT nasal spray SPRAY 2 SPRAYS INTO EACH NOSTRIL EVERY DAY 48 g 3  . losartan-hydrochlorothiazide (HYZAAR) 50-12.5 MG tablet TAKE 1 TABLET BY MOUTH EVERY DAY IN THE MORNING 90 tablet 0  . pravastatin (PRAVACHOL) 10 MG tablet Take 1 tablet (10 mg total) by mouth daily. 90 tablet 3  . sildenafil (VIAGRA) 100 MG tablet Take 0.5-1 tablets (50-100 mg total) by mouth daily as needed for erectile dysfunction. 5 tablet 11   No current facility-administered medications on file prior to visit.   Allergies  Allergen Reactions  . Codeine     Generalized itching   . Statins    Social History   Socioeconomic History  . Marital status: Married    Spouse name: Not on  file  . Number of children: Not on file  . Years of education: Not on file  . Highest education level: Not on file  Occupational History  . Not on file  Tobacco Use  . Smoking status: Never Smoker  . Smokeless tobacco: Never Used  Substance and Sexual Activity  . Alcohol use: No  . Drug use: No  . Sexual activity: Not on file  Other Topics Concern  . Not on file  Social History Narrative  . Not on file   Social Determinants of Health   Financial Resource Strain:   . Difficulty of Paying Living Expenses: Not on file  Food Insecurity:   . Worried About Programme researcher, broadcasting/film/video in the Last Year: Not on file  . Ran Out of Food in the Last Year: Not on file  Transportation Needs:   . Lack of Transportation (Medical): Not on file  . Lack of Transportation (Non-Medical): Not on file  Physical Activity:   . Days of Exercise per Week: Not on file  . Minutes of Exercise per Session: Not on file  Stress:   . Feeling of Stress : Not on file  Social Connections:   . Frequency of Communication with Friends and Family: Not on file  . Frequency of Social Gatherings with Friends and Family: Not on file  .  Attends Religious Services: Not on file  . Active Member of Clubs or Organizations: Not on file  . Attends Banker Meetings: Not on file  . Marital Status: Not on file  Intimate Partner Violence:   . Fear of Current or Ex-Partner: Not on file  . Emotionally Abused: Not on file  . Physically Abused: Not on file  . Sexually Abused: Not on file     Review of Systems  All other systems reviewed and are negative.      Objective:   Physical Exam Vitals reviewed.  Constitutional:      General: He is not in acute distress.    Appearance: Normal appearance. He is normal weight. He is not ill-appearing, toxic-appearing or diaphoretic.  HENT:     Head: Normocephalic and atraumatic.  Neck:     Vascular: No JVD.  Cardiovascular:     Rate and Rhythm: Normal rate and regular  rhythm.     Heart sounds: Murmur heard.  No friction rub. No gallop.   Pulmonary:     Effort: Pulmonary effort is normal. No respiratory distress.     Breath sounds: Normal breath sounds. No stridor. No wheezing, rhonchi or rales.  Chest:     Chest wall: No tenderness.  Musculoskeletal:     Left knee: No swelling, deformity, effusion, erythema or bony tenderness. Normal range of motion. No tenderness. No LCL laxity or MCL laxity.Normal meniscus.     Instability Tests: Anterior drawer test negative. Posterior drawer test negative. Medial McMurray test negative and lateral McMurray test negative.  Neurological:     Mental Status: He is alert. Mental status is at baseline.           Assessment & Plan:  Acute pain of left knee - Plan: DG Knee Complete 4 Views Left  I suspect that the patient may have irritated the cartilage on the undersurface of the patella due to trauma.  Recommend an x-ray of the left knee.  He can use meloxicam 15 mg p.o. daily as needed knee pain.  If pain becomes persistent or worsening, I would recommend a cortisone injection in the knee if the x-ray is normal.

## 2020-01-23 ENCOUNTER — Other Ambulatory Visit: Payer: Self-pay | Admitting: Family Medicine

## 2020-01-23 DIAGNOSIS — I10 Essential (primary) hypertension: Secondary | ICD-10-CM

## 2020-02-16 ENCOUNTER — Other Ambulatory Visit: Payer: Self-pay | Admitting: Family Medicine

## 2020-03-07 ENCOUNTER — Other Ambulatory Visit: Payer: Self-pay | Admitting: Family Medicine

## 2020-03-07 NOTE — Telephone Encounter (Signed)
Ok to refill 

## 2020-03-20 ENCOUNTER — Other Ambulatory Visit: Payer: Self-pay

## 2020-03-20 ENCOUNTER — Telehealth (INDEPENDENT_AMBULATORY_CARE_PROVIDER_SITE_OTHER): Payer: Medicare HMO | Admitting: Nurse Practitioner

## 2020-03-20 DIAGNOSIS — R141 Gas pain: Secondary | ICD-10-CM | POA: Insufficient documentation

## 2020-03-20 DIAGNOSIS — K5900 Constipation, unspecified: Secondary | ICD-10-CM | POA: Insufficient documentation

## 2020-03-20 DIAGNOSIS — J069 Acute upper respiratory infection, unspecified: Secondary | ICD-10-CM | POA: Diagnosis not present

## 2020-03-20 DIAGNOSIS — R143 Flatulence: Secondary | ICD-10-CM | POA: Insufficient documentation

## 2020-03-20 NOTE — Progress Notes (Signed)
Subjective:    Patient ID: Cody [name], male    DOB: [birth year], 70 y.o.   MRN: [id number]  HPI: Cody [name] is a 70 y.o. male presenting virtually for upper respiratory symptoms.  Chief Complaint  Patient presents with  . Headache    Head cold, congestions, chill, and cough. Has has covid vaccines, booster and flu vaccine. Will upload picture of card to mychart. Pt does not believe he need covid testing   UPPER RESPIRATORY TRACT INFECTION COVID testing history: with past URI, not this occurence COVID vaccine status: fully vaccinated for COVID with 2 Biomedical scientist booster Onset of symptoms: Friday/Saturday ~ 5 days ago Fever: no  Chills: yes Cough: yes; productive at times Shortness of breath: no Wheezing: no Chest pain: no Chest tightness: no Chest congestion: yes Nasal congestion: yes Runny nose: yes Post nasal drip: yes Sneezing: yes Sore throat: no Swollen glands: no Sinus pressure: no Headache: yes with coughing Face pain: no Toothache: no Ear pain: no  Ear pressure: no  Eyes red/itching:no Eye drainage/crusting: no  Nausea: no  Vomiting: no Diarrhea: no  Change in appetite: no  Loss of taste/smell: no  Rash: no Fatigue: yes Sick contacts: no Strep contacts: no  Context: better Recurrent sinusitis: no Treatments attempted: Mucinex DM Relief with OTC medications: yes  Allergies  Allergen Reactions  . Codeine     Generalized itching   . Statins     Outpatient Encounter Medications as of 03/20/2020  Medication Sig  . fluticasone (FLONASE) 50 MCG/ACT nasal spray SPRAY 2 SPRAYS INTO EACH NOSTRIL EVERY DAY  . losartan-hydrochlorothiazide (HYZAAR) 50-12.5 MG tablet TAKE 1 TABLET BY MOUTH EVERY DAY IN THE MORNING  . pravastatin (PRAVACHOL) 10 MG tablet TAKE 1 TABLET BY MOUTH EVERY DAY  . sildenafil (VIAGRA) 100 MG tablet TAKE 1/2-1 TABLET BY MOUTH DAILY AS NEEDED FOR ERECTILE DYSFUNCTION  . [DISCONTINUED] meloxicam (MOBIC) 15 MG  tablet Take 1 tablet (15 mg total) by mouth daily.  Marland Kitchen FLUZONE HIGH-DOSE QUADRIVALENT 0.7 ML SUSY Inject 0.7 mLs into the muscle once.   No facility-administered encounter medications on file as of 03/20/2020.    Patient Active Problem List   Diagnosis Date Noted  . Constipation 03/20/2020  . Flatulence, eructation and gas pain 03/20/2020  . Essential hypertension 03/22/2018  . Statin intolerance 03/08/2018  . Prediabetes   . CVA (cerebral infarction)   . Migraine headache 08/05/2010  . Leukopenia 08/05/2010    Past Medical History:  Diagnosis Date  . Acute URI 11/08/2012  . Allergy   . Bradycardia   . CVA (cerebral infarction)   . Leukopenia   . Migraine headache   . Prediabetes   . Statin intolerance 03/08/2018    Relevant past medical, surgical, family and social history reviewed and updated as indicated. Interim medical history since our last visit reviewed.  Review of Systems Per HPI unless specifically indicated above     Objective:    There were no vitals taken for this visit.  Wt Readings from Last 3 Encounters:  11/02/19 194 lb (88 kg)  03/06/19 193 lb (87.5 kg)  08/31/18 193 lb (87.5 kg)    Physical Exam Vitals and nursing note reviewed.  Constitutional:      General: He is not in acute distress.    Appearance: Normal appearance. He is not toxic-appearing.  HENT:     Head: Normocephalic and atraumatic.     Right Ear: External ear normal.  Left Ear: External ear normal.     Nose: Nose normal. No congestion.     Mouth/Throat:     Mouth: Mucous membranes are moist.     Pharynx: Oropharynx is clear. No posterior oropharyngeal erythema.  Eyes:     General: No scleral icterus.    Extraocular Movements: Extraocular movements intact.  Cardiovascular:     Comments: Unable to assess heart sounds via telemedicine visit. Pulmonary:     Effort: Pulmonary effort is normal. No respiratory distress.     Comments: Unable to assess lung sounds via virtual visit.   Patient talking in complete sentences during telemedicine visit without accessory muscle use.  Occasional congested-sounding cough. Skin:    Coloration: Skin is not jaundiced or pale.     Findings: No erythema.  Neurological:     Mental Status: He is alert and oriented to person, place, and time.  Psychiatric:        Mood and Affect: Mood normal.        Behavior: Behavior normal.        Thought Content: Thought content normal.        Judgment: Judgment normal.        Assessment & Plan:  1. Upper respiratory tract infection, unspecified type Acute, ongoing x 5 days.  Encouraged COVID testing.  Reassured patient that symptoms and exam findings are most consistent with a viral upper respiratory infection and explained lack of efficacy of antibiotics against viruses.  Discussed expected course and features suggestive of secondary bacterial infection.  Continue supportive care. Increase fluid intake with water or electrolyte solution like pedialyte. Encouraged acetaminophen as needed for fever/pain. Encouraged salt water gargling, chloraseptic spray and throat lozenges. Encouraged OTC guaifenesin. Encouraged saline sinus flushes and/or neti with humidified air.  If symptoms do not start to gradually improved in ~5-7 days, return to clinic for re-evaluation.  With any sudden onset new chest pain, dizziness, sweating, or shortness of breath, go to ED.  - SARS-COV-2 RNA,(COVID-19) QUAL NAAT    Follow up plan: Return if symptoms worsen or fail to improve.  Due to the catastrophic nature of the COVID-19 pandemic, this visit was completed via audio and visual contact via [facility] due to the restrictions of the COVID-19 pandemic. All issues as above were discussed and addressed. Physical exam was done as above through visual confirmation on [facility]. If it was felt that the patient should be evaluated in the office, they were directed there. The patient verbally consented to this  visit."} . Location of the patient: home . Location of the provider: work . Those involved with this call:  . Provider: Mardene Celeste, DNP . CMA: Wellington Hampshire, CMA . Front Desk/Registration: Flavia Shipper  . Time spent on call: 16 minutes with patient face to face via video conference. More than 50% of this time was spent in counseling and coordination of care. 30 minutes total spent in review of patient's record and preparation of their chart.  I verified patient identity using two factors (patient name and date of birth). Patient consents verbally to being seen via telemedicine visit today.

## 2020-03-20 NOTE — Patient Instructions (Signed)

## 2020-03-21 ENCOUNTER — Telehealth: Payer: Self-pay | Admitting: Family Medicine

## 2020-03-21 NOTE — Telephone Encounter (Signed)
Pt had pfizer vaccine 1st dose on 04-19-2019, 2nd dose on 05-10-2019 and pfizer booster on 12-08-2019

## 2020-03-22 ENCOUNTER — Telehealth: Payer: Self-pay

## 2020-03-22 LAB — SARS-COV-2 RNA,(COVID-19) QUALITATIVE NAAT: SARS CoV2 RNA: DETECTED — CR

## 2020-03-22 NOTE — Telephone Encounter (Signed)
Pt called to get the results of his Covid test. I have informed him the results are not back yet. However it would be a good idea to review his my-chart over the weekend for the results. Pt stated understanding and he is feeling better.

## 2020-03-25 ENCOUNTER — Telehealth: Payer: Self-pay | Admitting: Family Medicine

## 2020-03-25 NOTE — Telephone Encounter (Signed)
Pt called wanting to know the results of covid test  cb # [phone]

## 2020-03-25 NOTE — Telephone Encounter (Signed)
Call placed to patient and patient made aware.   Reports that he is feeling much improved. States that he continues to have cough, but it is not as bad as it has been.

## 2020-04-09 ENCOUNTER — Other Ambulatory Visit: Payer: Self-pay

## 2020-04-11 ENCOUNTER — Other Ambulatory Visit: Payer: Self-pay

## 2020-04-15 ENCOUNTER — Telehealth: Payer: Self-pay | Admitting: Family Medicine

## 2020-04-15 DIAGNOSIS — I10 Essential (primary) hypertension: Secondary | ICD-10-CM

## 2020-04-15 NOTE — Telephone Encounter (Signed)
[employer] requesting refill on fluticasone (FLONASE) 50 MCG/ACT nasal spray  losartan-hydrochlorothiazide (HYZAAR) 50-12.5 MG tablet  And pravastatin (PRAVACHOL) 10 MG tablet

## 2020-04-16 ENCOUNTER — Other Ambulatory Visit: Payer: Self-pay | Admitting: *Deleted

## 2020-04-16 MED ORDER — SILDENAFIL CITRATE 100 MG PO TABS: ORAL_TABLET | ORAL | 3 refills | Status: DC

## 2020-04-16 MED ORDER — PRAVASTATIN SODIUM 10 MG PO TABS: 10.0000 mg | ORAL_TABLET | Freq: Every day | ORAL | 3 refills | Status: DC

## 2020-04-16 MED ORDER — FLUTICASONE PROPIONATE 50 MCG/ACT NA SUSP: NASAL | 3 refills | Status: DC

## 2020-04-16 MED ORDER — LOSARTAN POTASSIUM-HCTZ 50-12.5 MG PO TABS: ORAL_TABLET | ORAL | 3 refills | Status: DC

## 2020-04-16 NOTE — Telephone Encounter (Signed)
Received fax requesting refill on sildenafil to mail order.  Ok to refill?

## 2020-04-16 NOTE — Telephone Encounter (Signed)
Prescription sent to pharmacy.

## 2020-04-24 ENCOUNTER — Other Ambulatory Visit: Payer: Self-pay | Admitting: Family Medicine

## 2020-04-24 DIAGNOSIS — I10 Essential (primary) hypertension: Secondary | ICD-10-CM

## 2020-05-09 ENCOUNTER — Encounter: Payer: Medicare HMO | Admitting: Family Medicine

## 2020-05-17 ENCOUNTER — Other Ambulatory Visit: Payer: Self-pay

## 2020-05-17 ENCOUNTER — Ambulatory Visit (INDEPENDENT_AMBULATORY_CARE_PROVIDER_SITE_OTHER): Payer: Medicare HMO | Admitting: Family Medicine

## 2020-05-17 ENCOUNTER — Encounter: Payer: Self-pay | Admitting: Family Medicine

## 2020-05-17 VITALS — BP 120/80 | HR 57 | Temp 96.8°F | Ht 68.0 in | Wt 195.2 lb

## 2020-05-17 DIAGNOSIS — Z8673 Personal history of transient ischemic attack (TIA), and cerebral infarction without residual deficits: Secondary | ICD-10-CM | POA: Diagnosis not present

## 2020-05-17 DIAGNOSIS — R7303 Prediabetes: Secondary | ICD-10-CM

## 2020-05-17 DIAGNOSIS — E785 Hyperlipidemia, unspecified: Secondary | ICD-10-CM

## 2020-05-17 DIAGNOSIS — Z0000 Encounter for general adult medical examination without abnormal findings: Secondary | ICD-10-CM | POA: Diagnosis not present

## 2020-05-17 DIAGNOSIS — Z125 Encounter for screening for malignant neoplasm of prostate: Secondary | ICD-10-CM | POA: Diagnosis not present

## 2020-05-17 DIAGNOSIS — Z0001 Encounter for general adult medical examination with abnormal findings: Secondary | ICD-10-CM

## 2020-05-17 DIAGNOSIS — I10 Essential (primary) hypertension: Secondary | ICD-10-CM | POA: Diagnosis not present

## 2020-05-17 NOTE — Progress Notes (Signed)
Subjective:    Patient ID: BARTON WANT, male    DOB: Apr 28, 1950, 70 y.o.   MRN: [id number]  HPI  Patient is a very pleasant 70 year old African-American gentleman here today for complete physical exam.  He has a remote history of a right periventricular caudate infarct on MRI in 2016.  This was found as part of a work-up for memory loss.  He is not taking a baby aspirin.  He is not sure why.  Therefore I recommended that he start taking a baby aspirin every day for secondary prevention of stroke.  Otherwise he has been doing well.  He is still working.  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  He denies any memory loss.  He is not getting lost driving.  He denies forgetting any names or conversations.  He denies any depression.  He denies any falls.  His blood pressure today is outstanding.  His last colonoscopy was in 2014.  It was completely normal.  He is due for repeat colonoscopy in 2024.  He is due for a PSA today to screen for prostate cancer.  He does not smoke and therefore does not require lung cancer screening.  Past Medical History:  Diagnosis Date  . Acute URI 11/08/2012  . Allergy   . Bradycardia   . CVA (cerebral infarction)   . Leukopenia   . Migraine headache   . Prediabetes   . Statin intolerance 03/08/2018   Past Surgical History:  Procedure Laterality Date  . SHOULDER SURGERY     right   Current Outpatient Medications on File Prior to Visit  Medication Sig Dispense Refill  . fluticasone (FLONASE) 50 MCG/ACT nasal spray SPRAY 2 SPRAYS INTO EACH NOSTRIL EVERY DAY 48 g 3  . losartan-hydrochlorothiazide (HYZAAR) 50-12.5 MG tablet TAKE 1 TABLET BY MOUTH EVERY DAY IN THE MORNING 90 tablet 3  . pravastatin (PRAVACHOL) 10 MG tablet Take 1 tablet (10 mg total) by mouth daily. 90 tablet 3  . sildenafil (VIAGRA) 100 MG tablet TAKE 1/2-1 TABLET BY MOUTH DAILY AS NEEDED FOR ERECTILE DYSFUNCTION 15 tablet 3   No current facility-administered medications on file prior to visit.    Allergies  Allergen Reactions  . Codeine     Generalized itching   . Statins    Social History   Socioeconomic History  . Marital status: Married    Spouse name: Not on file  . Number of children: Not on file  . Years of education: Not on file  . Highest education level: Not on file  Occupational History  . Not on file  Tobacco Use  . Smoking status: Never Smoker  . Smokeless tobacco: Never Used  Substance and Sexual Activity  . Alcohol use: No  . Drug use: No  . Sexual activity: Not on file  Other Topics Concern  . Not on file  Social History Narrative  . Not on file   Social Determinants of Health   Financial Resource Strain: Not on file  Food Insecurity: Not on file  Transportation Needs: Not on file  Physical Activity: Not on file  Stress: Not on file  Social Connections: Not on file  Intimate Partner Violence: Not on file   History reviewed. No pertinent family history.    Review of Systems  All other systems reviewed and are negative.      Objective:   Physical Exam Vitals reviewed.  Constitutional:      General: He is not in acute distress.  Appearance: He is well-developed. He is not diaphoretic.  HENT:     Head: Normocephalic and atraumatic.     Right Ear: External ear normal.     Left Ear: External ear normal.     Nose: Nose normal.     Mouth/Throat:     Pharynx: No oropharyngeal exudate.  Eyes:     General: No scleral icterus.       Right eye: No discharge.        Left eye: No discharge.     Conjunctiva/sclera: Conjunctivae normal.     Pupils: Pupils are equal, round, and reactive to light.  Neck:     Thyroid: No thyromegaly.     Vascular: No JVD.     Trachea: No tracheal deviation.  Cardiovascular:     Rate and Rhythm: Normal rate and regular rhythm.     Heart sounds: Murmur heard.  No friction rub. No gallop.   Pulmonary:     Effort: Pulmonary effort is normal. No respiratory distress.     Breath sounds: Normal breath  sounds. No stridor. No wheezing or rales.  Chest:     Chest wall: No tenderness.  Abdominal:     General: Bowel sounds are normal. There is no distension.     Palpations: Abdomen is soft. There is no mass.     Tenderness: There is no abdominal tenderness. There is no guarding or rebound.  Musculoskeletal:        General: No tenderness or deformity. Normal range of motion.     Cervical back: Normal range of motion and neck supple.  Lymphadenopathy:     Cervical: No cervical adenopathy.  Skin:    General: Skin is warm.     Coloration: Skin is not pale.     Findings: No erythema or rash.  Neurological:     Mental Status: He is alert and oriented to person, place, and time.     Cranial Nerves: No cranial nerve deficit.     Motor: No abnormal muscle tone.     Coordination: Coordination normal.     Deep Tendon Reflexes: Reflexes are normal and symmetric.  Psychiatric:        Behavior: Behavior normal.        Thought Content: Thought content normal.        Judgment: Judgment normal.     Faint 1/6 sem over av.      Assessment & Plan:   General medical exam - Plan: CBC with Differential/Platelet, COMPLETE METABOLIC PANEL WITH GFR, Lipid panel, PSA, Hemoglobin A1c  Essential hypertension - Plan: CBC with Differential/Platelet, COMPLETE METABOLIC PANEL WITH GFR, Lipid panel, PSA, Hemoglobin A1c  H/O: CVA (cerebrovascular accident) - Plan: CBC with Differential/Platelet, COMPLETE METABOLIC PANEL WITH GFR, Lipid panel, PSA, Hemoglobin A1c  Prostate cancer screening - Plan: CBC with Differential/Platelet, COMPLETE METABOLIC PANEL WITH GFR, Lipid panel, PSA, Hemoglobin A1c  Hyperlipidemia, unspecified hyperlipidemia type - Plan: CBC with Differential/Platelet, COMPLETE METABOLIC PANEL WITH GFR, Lipid panel, PSA, Hemoglobin A1c  Prediabetes - Plan: CBC with Differential/Platelet, COMPLETE METABOLIC PANEL WITH GFR, Lipid panel, PSA, Hemoglobin A1c  Physical exam today is completely  normal.  Blood pressure is outstanding.  I did recommend that the patient get his fourth Covid shot as recently recommended.  I also recommended that he get Shingrix.  I recommended that he start taking aspirin 81 mg a day for stroke prevention.  I would like to check his LDL cholesterol.  His goal LDL cholesterol given his history  of stroke would be less than 70.  He does have a history of prediabetes and therefore I will check a hemoglobin A1c as well.  Screen for prostate cancer with a PSA.  Also check a CBC and a CMP.  Colonoscopy is up-to-date.  He denies any depression, falls, memory loss.

## 2020-05-18 ENCOUNTER — Encounter: Payer: Self-pay | Admitting: Family Medicine

## 2020-05-18 LAB — CBC WITH DIFFERENTIAL/PLATELET
Absolute Monocytes: 371 cells/uL (ref 200–950)
Basophils Absolute: 29 cells/uL (ref 0–200)
Basophils Relative: 0.9 %
Eosinophils Absolute: 61 cells/uL (ref 15–500)
Eosinophils Relative: 1.9 %
HCT: 38.7 % (ref 38.5–50.0)
Hemoglobin: 12.2 g/dL — ABNORMAL LOW (ref 13.2–17.1)
Lymphs Abs: 2070 cells/uL (ref 850–3900)
MCH: 30.3 pg (ref 27.0–33.0)
MCHC: 31.5 g/dL — ABNORMAL LOW (ref 32.0–36.0)
MCV: 96 fL (ref 80.0–100.0)
MPV: 11.7 fL (ref 7.5–12.5)
Monocytes Relative: 11.6 %
Neutro Abs: 669 cells/uL — ABNORMAL LOW (ref 1500–7800)
Neutrophils Relative %: 20.9 %
Platelets: 169 10*3/uL (ref 140–400)
RBC: 4.03 10*6/uL — ABNORMAL LOW (ref 4.20–5.80)
RDW: 12.4 % (ref 11.0–15.0)
Total Lymphocyte: 64.7 %
WBC: 3.2 10*3/uL — ABNORMAL LOW (ref 3.8–10.8)

## 2020-05-18 LAB — LIPID PANEL
Cholesterol: 136 mg/dL (ref <200)
HDL: 55 mg/dL (ref >=40)
LDL Cholesterol (Calc): 64 mg/dL (calc)
Non-HDL Cholesterol (Calc): 81 mg/dL (calc) (ref <130)
Total CHOL/HDL Ratio: 2.5 (calc) (ref <5.0)
Triglycerides: 89 mg/dL (ref <150)

## 2020-05-18 LAB — COMPLETE METABOLIC PANEL WITH GFR
AG Ratio: 1.5 (calc) (ref 1.0–2.5)
ALT: 10 U/L (ref 9–46)
AST: 15 U/L (ref 10–35)
Albumin: 4 g/dL (ref 3.6–5.1)
Alkaline phosphatase (APISO): 62 U/L (ref 35–144)
BUN: 19 mg/dL (ref 7–25)
CO2: 24 mmol/L (ref 20–32)
Calcium: 9.1 mg/dL (ref 8.6–10.3)
Chloride: 106 mmol/L (ref 98–110)
Creat: 1.2 mg/dL (ref 0.70–1.25)
GFR, Est African American: 72 mL/min/{1.73_m2} (ref >=60)
GFR, Est Non African American: 62 mL/min/{1.73_m2} (ref >=60)
Globulin: 2.6 g/dL (calc) (ref 1.9–3.7)
Glucose, Bld: 90 mg/dL (ref 65–99)
Potassium: 4.5 mmol/L (ref 3.5–5.3)
Sodium: 140 mmol/L (ref 135–146)
Total Bilirubin: 0.6 mg/dL (ref 0.2–1.2)
Total Protein: 6.6 g/dL (ref 6.1–8.1)

## 2020-05-18 LAB — HEMOGLOBIN A1C
Hgb A1c MFr Bld: 5.5 % of total Hgb (ref <5.7)
Mean Plasma Glucose: 111 mg/dL
eAG (mmol/L): 6.2 mmol/L

## 2020-05-18 LAB — PSA: PSA: 0.74 ng/mL (ref <=4.0)

## 2020-05-20 ENCOUNTER — Encounter: Payer: Self-pay | Admitting: Family Medicine

## 2020-05-20 ENCOUNTER — Encounter: Payer: Self-pay | Admitting: *Deleted

## 2020-06-11 ENCOUNTER — Encounter: Payer: Self-pay | Admitting: Family Medicine

## 2020-06-23 DIAGNOSIS — R0602 Shortness of breath: Secondary | ICD-10-CM | POA: Diagnosis not present

## 2020-06-23 DIAGNOSIS — Z20822 Contact with and (suspected) exposure to covid-19: Secondary | ICD-10-CM | POA: Diagnosis not present

## 2020-06-23 DIAGNOSIS — J40 Bronchitis, not specified as acute or chronic: Secondary | ICD-10-CM | POA: Diagnosis not present

## 2020-07-12 ENCOUNTER — Ambulatory Visit (INDEPENDENT_AMBULATORY_CARE_PROVIDER_SITE_OTHER): Payer: Medicare HMO | Admitting: Family Medicine

## 2020-07-12 ENCOUNTER — Encounter: Payer: Self-pay | Admitting: Family Medicine

## 2020-07-12 ENCOUNTER — Other Ambulatory Visit: Payer: Self-pay

## 2020-07-12 VITALS — BP 128/64 | HR 82 | Temp 97.9°F | Resp 14 | Ht 68.0 in | Wt 195.0 lb

## 2020-07-12 DIAGNOSIS — M5412 Radiculopathy, cervical region: Secondary | ICD-10-CM | POA: Diagnosis not present

## 2020-07-12 MED ORDER — PREDNISONE 20 MG PO TABS: ORAL_TABLET | ORAL | 0 refills | Status: DC

## 2020-07-12 NOTE — Progress Notes (Signed)
Subjective:    Patient ID: Cody [name], male    DOB: April 30, 1950, 69 y.o.   MRN: [id number]  HPI Patient states that for the last 10 days, he has had pain in his left shoulder.  He states that it occurs mainly at night when he lays down.  The pain will start in his neck and radiate into his left shoulder.  He describes it as a deep ache that just will not go away.  He also is reporting some numbness and tingling radiating into his fingertips.  It began after he has been coughing recently.  He denies any pain with range of motion in the shoulder.  He has full and complete and painless range of motion in the left shoulder with no crepitus.  He has a negative empty can sign.  He has a negative Hawking sign.  He has a negative O'Brien sign.  Therefore there is no evidence of rotator cuff pathology or bursitis.  He has normal strength 5/5 equal and symmetric in both extremities.  He has normal reflexes and normal sensation.  However based on his symptoms, I suspect possibly a bulging disc in his neck putting pressure on the cervical nerve Past Medical History:  Diagnosis Date  . Acute URI 11/08/2012  . Allergy   . Bradycardia   . CVA (cerebral infarction)   . Leukopenia   . Migraine headache   . Prediabetes   . Statin intolerance 03/08/2018   Past Surgical History:  Procedure Laterality Date  . SHOULDER SURGERY     right   Current Outpatient Medications on File Prior to Visit  Medication Sig Dispense Refill  . aspirin 81 MG EC tablet Take by mouth.    . fluticasone (FLONASE) 50 MCG/ACT nasal spray SPRAY 2 SPRAYS INTO EACH NOSTRIL EVERY DAY 48 g 3  . losartan-hydrochlorothiazide (HYZAAR) 50-12.5 MG tablet TAKE 1 TABLET BY MOUTH EVERY DAY IN THE MORNING 90 tablet 3  . pravastatin (PRAVACHOL) 10 MG tablet Take 1 tablet (10 mg total) by mouth daily. 90 tablet 3  . sildenafil (VIAGRA) 100 MG tablet TAKE 1/2-1 TABLET BY MOUTH DAILY AS NEEDED FOR ERECTILE DYSFUNCTION 15 tablet 3   No  current facility-administered medications on file prior to visit.   Allergies  Allergen Reactions  . Codeine     Generalized itching   . Statins    Social History   Socioeconomic History  . Marital status: Married    Spouse name: Not on file  . Number of children: Not on file  . Years of education: Not on file  . Highest education level: Not on file  Occupational History  . Not on file  Tobacco Use  . Smoking status: Never Smoker  . Smokeless tobacco: Never Used  Substance and Sexual Activity  . Alcohol use: No  . Drug use: No  . Sexual activity: Not on file  Other Topics Concern  . Not on file  Social History Narrative  . Not on file   Social Determinants of Health   Financial Resource Strain: Not on file  Food Insecurity: Not on file  Transportation Needs: Not on file  Physical Activity: Not on file  Stress: Not on file  Social Connections: Not on file  Intimate Partner Violence: Not on file     Review of Systems  All other systems reviewed and are negative.      Objective:   Physical Exam Vitals reviewed.  Constitutional:      General:  He is not in acute distress.    Appearance: Normal appearance. He is normal weight. He is not ill-appearing, toxic-appearing or diaphoretic.  HENT:     Head: Normocephalic and atraumatic.  Neck:     Vascular: No carotid bruit or JVD.  Cardiovascular:     Rate and Rhythm: Normal rate and regular rhythm.     Heart sounds: Murmur heard.  No friction rub. No gallop.   Pulmonary:     Effort: Pulmonary effort is normal. No respiratory distress.     Breath sounds: Normal breath sounds. No stridor. No wheezing, rhonchi or rales.  Chest:     Chest wall: No tenderness.  Musculoskeletal:     Right shoulder: Normal.     Left shoulder: No swelling, deformity, tenderness, bony tenderness or crepitus. Normal range of motion. Normal strength.     Cervical back: Tenderness present. No spasms or bony tenderness. No pain with  movement. Normal range of motion.       Back:  Lymphadenopathy:     Cervical: No cervical adenopathy.  Skin:    General: Skin is warm.     Coloration: Skin is not jaundiced or pale.     Findings: No bruising, erythema, lesion or rash.  Neurological:     General: No focal deficit present.     Mental Status: He is alert and oriented to person, place, and time. Mental status is at baseline.     Cranial Nerves: No cranial nerve deficit.     Sensory: No sensory deficit.     Motor: No weakness.     Coordination: Coordination normal.     Gait: Gait normal.     Deep Tendon Reflexes: Reflexes normal.  Psychiatric:        Mood and Affect: Mood normal.        Behavior: Behavior normal.        Thought Content: Thought content normal.        Judgment: Judgment normal.           Assessment & Plan:  Cervical radiculopathy  Exam of the shoulder is completely normal.  I suspect cervical radiculopathy.  We will try a prednisone taper pack to see if this helps.  If symptoms persist into next week, I would recommend an x-ray of the cervical spine as well as of the shoulder to evaluate further.  Patient is comfortable with this plan

## 2020-08-02 ENCOUNTER — Telehealth: Payer: Self-pay | Admitting: *Deleted

## 2020-08-02 DIAGNOSIS — M5412 Radiculopathy, cervical region: Secondary | ICD-10-CM

## 2020-08-02 NOTE — Telephone Encounter (Signed)
Received call from patient [phone]~ telephone.   Reports that he was seen on 07/12/2020 for shoulder/ nec pain. Reports that he was given Prednisone taper which helped pain, but has now returned.   Inquired if he could refill taper.   Plan from 07/12/2020: Cervical radiculopathy   Exam of the shoulder is completely normal.  I suspect cervical radiculopathy.  We will try a prednisone taper pack to see if this helps.  If symptoms persist into next week, I would recommend an x-ray of the cervical spine as well as of the shoulder to evaluate further.  Patient is comfortable with this plan  Please advise.

## 2020-08-05 MED ORDER — PREDNISONE 20 MG PO TABS: ORAL_TABLET | ORAL | 0 refills | Status: DC

## 2020-08-05 NOTE — Telephone Encounter (Signed)
Prescription sent to pharmacy.   X-Ray ordered.   Call placed to patient. LMTRC.

## 2020-08-07 NOTE — Telephone Encounter (Signed)
Multiple calls placed to patient with no answer and no return call.   Message to be closed.  

## 2020-09-10 ENCOUNTER — Telehealth: Payer: Self-pay | Admitting: *Deleted

## 2020-09-10 NOTE — Chronic Care Management (AMB) (Signed)
  Chronic Care Management   Note  09/10/2020 Name: Cody [name] MRN: [id number] DOB: Feb 05, 1951  [name] is a 70 y.o. year old male who is a primary care patient of Susy Frizzle, MD. I reached out to [name] by phone today in response to a referral sent by [name]'s PCP, Dr. Dennard Schaumann.      [name] was given information about Chronic Care Management services today including:  CCM service includes personalized support from designated clinical staff supervised by his physician, including individualized plan of care and coordination with other care providers 24/7 contact phone numbers for assistance for urgent and routine care needs. Service will only be billed when office clinical staff spend 20 minutes or more in a month to coordinate care. Only one practitioner may furnish and bill the service in a calendar month. The patient may stop CCM services at any time (effective at the end of the month) by phone call to the office staff. The patient will be responsible for cost sharing (co-pay) of up to 20% of the service fee (after annual deductible is met).  Patient agreed to services and verbal consent obtained.   Follow up plan: Telephone appointment with care management team member scheduled for:10/04/20  Chonda Baney  Care Guide, Embedded Care Coordination [place]  Care Management  Direct Dial: [phone]

## 2020-09-11 DIAGNOSIS — H52223 Regular astigmatism, bilateral: Secondary | ICD-10-CM | POA: Diagnosis not present

## 2020-09-24 ENCOUNTER — Other Ambulatory Visit: Payer: Self-pay | Admitting: Family Medicine

## 2020-10-04 ENCOUNTER — Ambulatory Visit (INDEPENDENT_AMBULATORY_CARE_PROVIDER_SITE_OTHER): Payer: Medicare HMO | Admitting: *Deleted

## 2020-10-04 DIAGNOSIS — E785 Hyperlipidemia, unspecified: Secondary | ICD-10-CM | POA: Diagnosis not present

## 2020-10-04 DIAGNOSIS — I10 Essential (primary) hypertension: Secondary | ICD-10-CM | POA: Diagnosis not present

## 2020-10-04 NOTE — Patient Instructions (Addendum)
Visit Information   PATIENT GOALS:   Goals Addressed             This Visit's Progress    Hyperlipidemia- Maintain My Quality of Life       Timeframe:  Long-Range Goal Priority:  Medium Start Date:        10/04/2020                     Expected End Date:     04/06/2021                  Follow Up Date - 11/29/2020   - complete a living will and health care power of attorney - documents mailed to you, call RN care manager if any questions at [phone] - make shared treatment decisions with doctor - limit/ avoid saturated/ trans fats in diet - continue walking everyday and keep up the good work - look over education sent via My Chart- heart healthy diet   Why is this important?   Having a long-term illness can be scary.  It can also be stressful for you and your caregiver.  These steps may help.    Notes:      Track and Manage My Blood Pressure-Hypertension       Timeframe:  Long-Range Goal Priority:  Medium Start Date:            10/04/2020                 Expected End Date:       04/06/2021                Follow Up Date  11/29/2020   - check blood pressure 3 times per week - choose a place to take my blood pressure (home, clinic or office, retail store) - write blood pressure results in a log or diary  - please follow a low sodium diet - education sent via My Chart- low sodium - take all medications as prescribed - continue to exercise daily   Why is this important?   You won't feel high blood pressure, but it can still hurt your blood vessels.  High blood pressure can cause heart or kidney problems. It can also cause a stroke.  Making lifestyle changes like losing a little weight or eating less salt will help.  Checking your blood pressure at home and at different times of the day can help to control blood pressure.  If the doctor prescribes medicine remember to take it the way the doctor ordered.  Call the office if you cannot afford the medicine or if there are  questions about it.     Notes:         Consent to CCM Services: Mr. Opdahl was given information about Chronic Care Management services including:  CCM service includes personalized support from designated clinical staff supervised by his physician, including individualized plan of care and coordination with other care providers 24/7 contact phone numbers for assistance for urgent and routine care needs. Service will only be billed when office clinical staff spend 20 minutes or more in a month to coordinate care. Only one practitioner may furnish and bill the service in a calendar month. The patient may stop CCM services at any time (effective at the end of the month) by phone call to the office staff. The patient will be responsible for cost sharing (co-pay) of up to 20% of the service fee (after annual deductible is met).  Patient agreed to services and verbal consent obtained.   Patient verbalizes understanding of instructions provided today and agrees to view in [place].   Telephone follow up appointment with care management team member scheduled for:  10/14/2022Heart-Healthy Eating Plan Heart-healthy meal planning includes: Eating less unhealthy fats. Eating more healthy fats. Making other changes in your diet. Talk with your doctor or a diet specialist (dietitian) to create an eating plan that is right for you. What is my plan? Your doctor may recommend an eating plan that includes: Total fat: ______% or less of total calories a day. Saturated fat: ______% or less of total calories a day. Cholesterol: less than _________mg a day. What are tips for following this plan? Cooking Avoid frying your food. Try to bake, boil, grill, or broil it instead. You can also reduce fat by: Removing the skin from poultry. Removing all visible fats from meats. Steaming vegetables in water or broth. Meal planning  At meals, divide your plate into four equal parts: Fill one-half of  your plate with vegetables and green salads. Fill one-fourth of your plate with whole grains. Fill one-fourth of your plate with lean protein foods. Eat 4-5 servings of vegetables per day. A serving of vegetables is: 1 cup of raw or cooked vegetables. 2 cups of raw leafy greens. Eat 4-5 servings of fruit per day. A serving of fruit is: 1 medium whole fruit.  cup of dried fruit.  cup of fresh, frozen, or canned fruit.  cup of 100% fruit juice. Eat more foods that have soluble fiber. These are apples, broccoli, carrots, beans, peas, and barley. Try to get 20-30 g of fiber per day. Eat 4-5 servings of nuts, legumes, and seeds per week: 1 serving of dried beans or legumes equals  cup after being cooked. 1 serving of nuts is  cup. 1 serving of seeds equals 1 tablespoon.  General information Eat more home-cooked food. Eat less restaurant, buffet, and fast food. Limit or avoid alcohol. Limit foods that are high in starch and sugar. Avoid fried foods. Lose weight if you are overweight. Keep track of how much salt (sodium) you eat. This is important if you have high blood pressure. Ask your doctor to tell you more about this. Try to add vegetarian meals each week. Fats Choose healthy fats. These include olive oil and canola oil, flaxseeds, walnuts, almonds, and seeds. Eat more omega-3 fats. These include salmon, mackerel, sardines, tuna, flaxseed oil, and ground flaxseeds. Try to eat fish at least 2 times each week. Check food labels. Avoid foods with trans fats or high amounts of saturated fat. Limit saturated fats. These are often found in animal products, such as meats, butter, and cream. These are also found in plant foods, such as palm oil, palm kernel oil, and coconut oil. Avoid foods with partially hydrogenated oils in them. These have trans fats. Examples are stick margarine, some tub margarines, cookies, crackers, and other baked goods. What foods can I eat? Fruits All fresh,  canned (in natural juice), or frozen fruits. Vegetables Fresh or frozen vegetables (raw, steamed, roasted, or grilled). Green salads. Grains Most grains. Choose whole wheat and whole grains most of the time. Rice andpasta, including brown rice and pastas made with whole wheat. Meats and other proteins Lean, well-trimmed beef, veal, pork, and lamb. Chicken and [place] without skin. All fish and shellfish. Wild duck, rabbit, pheasant, and venison. Egg whites or low-cholesterol egg substitutes. Dried beans, peas, lentils, and tofu. Seedsand most nuts. Dairy Low-fat  or nonfat cheeses, including ricotta and mozzarella. Skim or 1% milk that is liquid, powdered, or evaporated. Buttermilk that is made with low-fatmilk. Nonfat or low-fat yogurt. Fats and oils Non-hydrogenated (trans-free) margarines. Vegetable oils, including soybean, sesame, sunflower, olive, peanut, safflower, corn, canola, and cottonseed. Salad dressings or mayonnaisemade with a vegetable oil. Beverages Mineral water. Coffee and tea. Diet carbonated beverages. Sweets and desserts Sherbet, gelatin, and fruit ice. Small amounts of dark chocolate. Limit all sweets and desserts. Seasonings and condiments All seasonings and condiments. The items listed above may not be a complete list of foods and drinks you can eat. Contact a dietitian for more options. What foods should I avoid? Fruits Canned fruit in heavy syrup. Fruit in cream or butter sauce. Fried fruit. Limitcoconut. Vegetables Vegetables cooked in cheese, cream, or butter sauce. Fried vegetables. Grains Breads that are made with saturated or trans fats, oils, or whole milk. Croissants. Sweet rolls. Donuts. High-fat crackers,such as cheese crackers. Meats and other proteins Fatty meats, such as hot dogs, ribs, sausage, bacon, rib-eye roast or steak. High-fat deli meats, such as salami and bologna. Caviar. Domestic duck andgoose. Organ meats, such as liver. Dairy Cream, sour  cream, cream cheese, and creamed cottage cheese. Whole-milk cheeses. Whole or 2% milk that is liquid, evaporated, or condensed. Whole buttermilk. Cream sauce or high-fat cheese sauce. Yogurt that is made fromwhole milk. Fats and oils Meat fat, or shortening. Cocoa butter, hydrogenated oils, palm oil, coconut oil, palm kernel oil. Solid fats and shortenings, including bacon fat, salt pork, lard, and butter. Nondairy cream substitutes. Salad dressings with cheeseor sour cream. Beverages Regular sodas and juice drinks with added sugar. Sweets and desserts Frosting. Pudding. Cookies. Cakes. Pies. Milk chocolate or white chocolate.Buttered syrups. Full-fat ice cream or ice cream drinks. The items listed above may not be a complete list of foods and drinks to avoid. Contact a dietitian for more information. Summary Heart-healthy meal planning includes eating less unhealthy fats, eating more healthy fats, and making other changes in your diet. Eat a balanced diet. This includes fruits and vegetables, low-fat or nonfat dairy, lean protein, nuts and legumes, whole grains, and heart-healthy oils and fats. This information is not intended to replace advice given to you by your health care provider. Make sure you discuss any questions you have with your healthcare provider. Document Revised: 04/08/2017 Document Reviewed: 03/12/2017 Elsevier Patient Education  [email or web address]. [email or web address]">  DASH Eating Plan DASH stands for Dietary Approaches to Stop Hypertension. The DASH eating plan is a healthy eating plan that has been shown to: Reduce high blood pressure (hypertension). Reduce your risk for type 2 diabetes, heart disease, and stroke. Help with weight loss. What are tips for following this plan? Reading food labels Check food labels for the amount of salt (sodium) per serving. Choose foods with less than 5 percent of the Daily Value of sodium.  Generally, foods with less than 300 milligrams (mg) of sodium per serving fit into this eating plan. To find whole grains, look for the word "whole" as the first word in the ingredient list. Shopping Buy products labeled as "low-sodium" or "no salt added." Buy fresh foods. Avoid canned foods and pre-made or frozen meals. Cooking Avoid adding salt when cooking. Use salt-free seasonings or herbs instead of table salt or sea salt. Check with your health care provider or pharmacist before using salt substitutes. Do not fry foods. Cook foods using healthy methods such as baking, boiling, grilling, roasting, and broiling instead. Cook with  heart-healthy oils, such as olive, canola, avocado, soybean, or sunflower oil. Meal planning  Eat a balanced diet that includes: 4 or more servings of fruits and 4 or more servings of vegetables each day. Try to fill one-half of your plate with fruits and vegetables. 6-8 servings of whole grains each day. Less than 6 oz (170 g) of lean meat, poultry, or fish each day. A 3-oz (85-g) serving of meat is about the same size as a deck of cards. One egg equals 1 oz (28 g). 2-3 servings of low-fat dairy each day. One serving is 1 cup (237 mL). 1 serving of nuts, seeds, or beans 5 times each week. 2-3 servings of heart-healthy fats. Healthy fats called omega-3 fatty acids are found in foods such as walnuts, flaxseeds, fortified milks, and eggs. These fats are also found in cold-water fish, such as sardines, salmon, and mackerel. Limit how much you eat of: Canned or prepackaged foods. Food that is high in trans fat, such as some fried foods. Food that is high in saturated fat, such as fatty meat. Desserts and other sweets, sugary drinks, and other foods with added sugar. Full-fat dairy products. Do not salt foods before eating. Do not eat more than 4 egg yolks a week. Try to eat at least 2 vegetarian meals a week. Eat more home-cooked food and less restaurant, buffet,  and fast food.  Lifestyle When eating at a restaurant, ask that your food be prepared with less salt or no salt, if possible. If you drink alcohol: Limit how much you use to: 0-1 drink a day for women who are not pregnant. 0-2 drinks a day for men. Be aware of how much alcohol is in your drink. In the [place] one drink equals one 12 oz bottle of beer (355 mL), one 5 oz glass of wine (148 mL), or one 1 oz glass of hard liquor (44 mL). General information Avoid eating more than 2,300 mg of salt a day. If you have hypertension, you may need to reduce your sodium intake to 1,500 mg a day. Work with your health care provider to maintain a healthy body weight or to lose weight. Ask what an ideal weight is for you. Get at least 30 minutes of exercise that causes your heart to beat faster (aerobic exercise) most days of the week. Activities may include walking, swimming, or biking. Work with your health care provider or dietitian to adjust your eating plan to your individual calorie needs. What foods should I eat? Fruits All fresh, dried, or frozen fruit. Canned fruit in natural juice (without addedsugar). Vegetables Fresh or frozen vegetables (raw, steamed, roasted, or grilled). Low-sodium or reduced-sodium tomato and vegetable juice. Low-sodium or reduced-sodium tomatosauce and tomato paste. Low-sodium or reduced-sodium canned vegetables. Grains Whole-grain or whole-wheat bread. Whole-grain or whole-wheat pasta. Brown rice. [place]. Bulgur. Whole-grain and low-sodium cereals. Pita bread.Low-fat, low-sodium crackers. Whole-wheat flour tortillas. Meats and other proteins Skinless chicken or [place]. Ground chicken or [place]. Pork with fat trimmed off. Fish and seafood. Egg whites. Dried beans, peas, or lentils. Unsalted nuts, nut butters, and seeds. Unsalted canned beans. Lean cuts of beef with fat trimmed off. Low-sodium, lean precooked or cured meat, such as sausages or  meatloaves. Dairy Low-fat (1%) or fat-free (skim) milk. Reduced-fat, low-fat, or fat-free cheeses. Nonfat, low-sodium ricotta or cottage cheese. Low-fat or nonfatyogurt. Low-fat, low-sodium cheese. Fats and oils Soft margarine without trans fats. Vegetable oil. Reduced-fat, low-fat, or light mayonnaise and salad dressings (reduced-sodium). Canola, safflower,  olive, avocado, soybean, andsunflower oils. Avocado. Seasonings and condiments Herbs. Spices. Seasoning mixes without salt. Other foods Unsalted popcorn and pretzels. Fat-free sweets. The items listed above may not be a complete list of foods and beverages you can eat. Contact a dietitian for more information. What foods should I avoid? Fruits Canned fruit in a light or heavy syrup. Fried fruit. Fruit in cream or buttersauce. Vegetables Creamed or fried vegetables. Vegetables in a cheese sauce. Regular canned vegetables (not low-sodium or reduced-sodium). Regular canned tomato sauce and paste (not low-sodium or reduced-sodium). Regular tomato and vegetable juice(not low-sodium or reduced-sodium). Angie Fava. Olives. Grains Baked goods made with fat, such as croissants, muffins, or some breads. Drypasta or rice meal packs. Meats and other proteins Fatty cuts of meat. Ribs. Fried meat. Berniece Salines. Bologna, salami, and other precooked or cured meats, such as sausages or meat loaves. Fat from the back of a pig (fatback). Bratwurst. Salted nuts and seeds. Canned beans with added salt. Canned orsmoked fish. Whole eggs or egg yolks. Chicken or [place] with skin. Dairy Whole or 2% milk, cream, and half-and-half. Whole or full-fat cream cheese. Whole-fat or sweetened yogurt. Full-fat cheese. Nondairy creamers. Whippedtoppings. Processed cheese and cheese spreads. Fats and oils Butter. Stick margarine. Lard. Shortening. Ghee. Bacon fat. Tropical oils, suchas coconut, palm kernel, or palm oil. Seasonings and condiments Onion salt, garlic salt, seasoned salt,  table salt, and sea salt. Worcestershire sauce. Tartar sauce. Barbecue sauce. Teriyaki sauce. Soy sauce, including reduced-sodium. Steak sauce. Canned and packaged gravies. Fish sauce. Oyster sauce. Cocktail sauce. Store-bought horseradish. Ketchup. Mustard. Meat flavorings and tenderizers. Bouillon cubes. Hot sauces. Pre-made or packaged marinades. Pre-made or packaged taco seasonings. Relishes. Regular saladdressings. Other foods Salted popcorn and pretzels. The items listed above may not be a complete list of foods and beverages you should avoid. Contact a dietitian for more information. Where to find more information National Heart, Lung, and Blood Institute: https://wilson-eaton.com/ American Heart Association: www.heart.org Academy of Nutrition and Dietetics: www.eatright.[place]: www.kidney.org Summary The DASH eating plan is a healthy eating plan that has been shown to reduce high blood pressure (hypertension). It may also reduce your risk for type 2 diabetes, heart disease, and stroke. When on the DASH eating plan, aim to eat more fresh fruits and vegetables, whole grains, lean proteins, low-fat dairy, and heart-healthy fats. With the DASH eating plan, you should limit salt (sodium) intake to 2,300 mg a day. If you have hypertension, you may need to reduce your sodium intake to 1,500 mg a day. Work with your health care provider or dietitian to adjust your eating plan to your individual calorie needs. This information is not intended to replace advice given to you by your health care provider. Make sure you discuss any questions you have with your healthcare provider. Document Revised: 01/06/2019 Document Reviewed: 01/06/2019 Elsevier Patient Education  2022 [facility], BSN RN Case Manager [facility] [phone]   CLINICAL CARE PLAN: Patient Care Plan: Hyperlipidemia     Problem Identified: Health Promotion or Disease  Self-Management- Hyperlipidemia   Priority: Medium     Long-Range Goal: Hyperlipidemia Self-Management Plan Developed   Start Date: 10/04/2020  Expected End Date: 04/06/2021  This Visit's Progress: On track  Priority: Medium  Note:   Current Barriers:  Current antihyperlipidemic regimen: pravastatin Most recent lipid panel:     Component Value Date/Time   CHOL 136 05/17/2020 0841   TRIG 89 05/17/2020 0841   HDL 55  05/17/2020 0841   CHOLHDL 2.5 05/17/2020 0841   VLDL 22 06/18/2016 0842   LDLCALC 64 05/17/2020 0841  Patient lives with spouse, is independent in all aspects of his care, requests advance directives packet be mailed.  Pt reports he exercises daily and walks 12,000 steps or approximately 5 miles daily, has all medications and taking as prescribed. ASCVD risk enhancing conditions: age >54,  HTN Does not adhere to provider recommendations re:  does not always adhere to heart healthy diet RN Care Manager Clinical Goal(s):  patient will work with Consulting civil engineer, providers, and care team towards execution of optimized self-health management plan patient will verbalize basic understanding of hyperlipidemia disease process and self health management plan the patient will work with the care management team towards completion of advanced directives Interventions: Collaboration with Susy Frizzle, MD regarding development and update of comprehensive plan of care as evidenced by provider attestation and co-signature Inter-disciplinary care team collaboration (see longitudinal plan of care) Medication review performed; medication list updated in electronic medical record.  Inter-disciplinary care team collaboration (see longitudinal plan of care) Reviewed importance of heart healthy diet Education sent via My Chart Heart Healthy Diet Encouraged pt to continue exercising daily and being active in community/ church Patient Goals/Self-Care Activities: - complete a living will and  health care power of attorney - documents mailed to you, call RN care manager if any questions at [phone] - make shared treatment decisions with doctor - limit/ avoid saturated/ trans fats in diet - continue walking everyday and keep up the good work - look over education sent via My Chart- heart healthy diet Follow Up Plan: Telephone follow up appointment with care management team member scheduled for:   11/29/2020      Patient Care Plan: Hypertension (Adult)     Problem Identified: Hypertension (Hypertension)   Priority: Medium     Long-Range Goal: Hypertension Monitored   Start Date: 10/04/2020  Expected End Date: 04/06/2021  This Visit's Progress: On track  Priority: Medium  Note:   Objective:  Last practice recorded BP readings:  BP Readings from Last 3 Encounters:  07/12/20 128/64  05/17/20 120/80  11/02/19 98/60   Most recent eGFR/CrCl: No results found for: EGFR  No components found for: CRCL Current Barriers:  Knowledge Deficits related to basic understanding of hypertension pathophysiology and self care management- patient does not monitor blood pressure, has a cuff and may try to start checking.  Patient reports he tries to eat a low sodium diet, does eat fast food at times, exercises (walks) daily. Case Manager Clinical Goal(s):  patient will verbalize understanding of plan for hypertension management patient will demonstrate improved adherence to prescribed treatment plan for hypertension as evidenced by taking all medications as prescribed, monitoring and recording blood pressure as directed, adhering to low sodium/DASH diet Interventions:  Collaboration with Susy Frizzle, MD regarding development and update of comprehensive plan of care as evidenced by provider attestation and co-signature Inter-disciplinary care team collaboration (see longitudinal plan of care) Evaluation of current treatment plan related to hypertension self management and patient's  adherence to plan as established by provider. Provided education to patient re: stroke prevention, s/s of heart attack and stroke, DASH diet, complications of uncontrolled blood pressure Reviewed medications with patient and discussed importance of compliance Discussed plans with patient for ongoing care management follow up and provided patient with direct contact information for care management team Advised patient, providing education and rationale, to monitor blood pressure 3 x  per week and record, calling PCP for findings outside established parameters.  Reviewed scheduled/upcoming provider appointments including: confirmed pt has no upcoming scheduled appointments Sent education via My Chart- low sodium diet Self-Care Activities:  Attends all scheduled provider appointments Calls provider office for new concerns, questions, or BP outside discussed parameters Checks BP and records as discussed Follows a low sodium diet/DASH diet Patient Goals: - check blood pressure 3 times per week - choose a place to take my blood pressure (home, clinic or office, retail store) - write blood pressure results in a log or diary  - please follow a low sodium diet - education sent via My Chart- low sodium - take all medications as prescribed - continue to exercise daily Follow Up Plan: Telephone follow up appointment with care management team member scheduled for:   11/29/2020

## 2020-10-04 NOTE — Chronic Care Management (AMB) (Signed)
Chronic Care Management   CCM RN Visit Note  10/04/2020 Name: Cody [name] MRN: [id number] DOB: 05-06-1950  Subjective: Cody [name] is a 70 y.o. year old male who is a primary care patient of Pickard, Cammie Mcgee, MD. The care management team was consulted for assistance with disease management and care coordination needs.    Engaged with patient by telephone for initial visit in response to provider referral for case management and/or care coordination services.   Consent to Services:  The patient was given the following information about Chronic Care Management services today, agreed to services, and gave verbal consent: 1. CCM service includes personalized support from designated clinical staff supervised by the primary care provider, including individualized plan of care and coordination with other care providers 2. 24/7 contact phone numbers for assistance for urgent and routine care needs. 3. Service will only be billed when office clinical staff spend 20 minutes or more in a month to coordinate care. 4. Only one practitioner may furnish and bill the service in a calendar month. 5.The patient may stop CCM services at any time (effective at the end of the month) by phone call to the office staff. 6. The patient will be responsible for cost sharing (co-pay) of up to 20% of the service fee (after annual deductible is met). Patient agreed to services and consent obtained.  Patient agreed to services and verbal consent obtained.   Assessment: Review of patient past medical history, allergies, medications, health status, including review of consultants reports, laboratory and other test data, was performed as part of comprehensive evaluation and provision of chronic care management services.   SDOH (Social Determinants of Health) assessments and interventions performed:  SDOH Interventions    Flowsheet Row Most Recent Value  SDOH Interventions   Food Insecurity Interventions  Intervention Not Indicated  Housing Interventions Intervention Not Indicated  Transportation Interventions Intervention Not Indicated        CCM Care Plan  Allergies  Allergen Reactions   Codeine     Generalized itching    Statins     Outpatient Encounter Medications as of 10/04/2020  Medication Sig   aspirin 81 MG EC tablet Take by mouth.   fluticasone (FLONASE) 50 MCG/ACT nasal spray SPRAY 2 SPRAYS INTO EACH NOSTRIL EVERY DAY   losartan-hydrochlorothiazide (HYZAAR) 50-12.5 MG tablet TAKE 1 TABLET BY MOUTH EVERY DAY IN THE MORNING   pravastatin (PRAVACHOL) 10 MG tablet TAKE 1 TABLET EVERY DAY   sildenafil (VIAGRA) 100 MG tablet TAKE 1/2-1 TABLET BY MOUTH DAILY AS NEEDED FOR ERECTILE DYSFUNCTION   predniSONE (DELTASONE) 20 MG tablet 3 tabs poqday 1-2, 2 tabs poqday 3-4, 1 tab poqday 5-6 (Patient not taking: Reported on 10/04/2020)   No facility-administered encounter medications on file as of 10/04/2020.    Patient Active Problem List   Diagnosis Date Noted   Constipation 03/20/2020   Flatulence, eructation and gas pain 03/20/2020   Essential hypertension 03/22/2018   Statin intolerance 03/08/2018   Prediabetes    CVA (cerebral infarction)    Migraine headache 08/05/2010   Leukopenia 08/05/2010    Conditions to be addressed/monitored:HTN and HLD  Care Plan : Hyperlipidemia  Updates made by Kassie Mends, RN since 10/04/2020 12:00 AM     Problem: Health Promotion or Disease Self-Management- Hyperlipidemia   Priority: Medium     Long-Range Goal: Hyperlipidemia Self-Management Plan Developed   Start Date: 10/04/2020  Expected End Date: 04/06/2021  This Visit's Progress: On track  Priority: Medium  Note:   Current Barriers:  Current antihyperlipidemic regimen: pravastatin Most recent lipid panel:     Component Value Date/Time   CHOL 136 05/17/2020 0841   TRIG 89 05/17/2020 0841   HDL 55 05/17/2020 0841   CHOLHDL 2.5 05/17/2020 0841   VLDL 22 06/18/2016 0842    LDLCALC 64 05/17/2020 0841  Patient lives with spouse, is independent in all aspects of his care, requests advance directives packet be mailed.  Pt reports he exercises daily and walks 12,000 steps or approximately 5 miles daily, has all medications and taking as prescribed. ASCVD risk enhancing conditions: age >20,  HTN Does not adhere to provider recommendations re:  does not always adhere to heart healthy diet RN Care Manager Clinical Goal(s):  patient will work with Consulting civil engineer, providers, and care team towards execution of optimized self-health management plan patient will verbalize basic understanding of hyperlipidemia disease process and self health management plan the patient will work with the care management team towards completion of advanced directives Interventions: Collaboration with Susy Frizzle, MD regarding development and update of comprehensive plan of care as evidenced by provider attestation and co-signature Inter-disciplinary care team collaboration (see longitudinal plan of care) Medication review performed; medication list updated in electronic medical record.  Inter-disciplinary care team collaboration (see longitudinal plan of care) Reviewed importance of heart healthy diet Education sent via My Chart Heart Healthy Diet Encouraged pt to continue exercising daily and being active in community/ church Patient Goals/Self-Care Activities: - complete a living will and health care power of attorney - documents mailed to you, call RN care manager if any questions at [phone] - make shared treatment decisions with doctor - limit/ avoid saturated/ trans fats in diet - continue walking everyday and keep up the good work - look over education sent via My Chart- heart healthy diet Follow Up Plan: Telephone follow up appointment with care management team member scheduled for:   11/29/2020      Care Plan : Hypertension (Adult)  Updates made by Kassie Mends,  RN since 10/04/2020 12:00 AM     Problem: Hypertension (Hypertension)   Priority: Medium     Long-Range Goal: Hypertension Monitored   Start Date: 10/04/2020  Expected End Date: 04/06/2021  This Visit's Progress: On track  Priority: Medium  Note:   Objective:  Last practice recorded BP readings:  BP Readings from Last 3 Encounters:  07/12/20 128/64  05/17/20 120/80  11/02/19 98/60   Most recent eGFR/CrCl: No results found for: EGFR  No components found for: CRCL Current Barriers:  Knowledge Deficits related to basic understanding of hypertension pathophysiology and self care management- patient does not monitor blood pressure, has a cuff and may try to start checking.  Patient reports he tries to eat a low sodium diet, does eat fast food at times, exercises (walks) daily. Case Manager Clinical Goal(s):  patient will verbalize understanding of plan for hypertension management patient will demonstrate improved adherence to prescribed treatment plan for hypertension as evidenced by taking all medications as prescribed, monitoring and recording blood pressure as directed, adhering to low sodium/DASH diet Interventions:  Collaboration with Susy Frizzle, MD regarding development and update of comprehensive plan of care as evidenced by provider attestation and co-signature Inter-disciplinary care team collaboration (see longitudinal plan of care) Evaluation of current treatment plan related to hypertension self management and patient's adherence to plan as established by provider. Provided education to patient re: stroke prevention, s/s of heart attack and stroke, DASH  diet, complications of uncontrolled blood pressure Reviewed medications with patient and discussed importance of compliance Discussed plans with patient for ongoing care management follow up and provided patient with direct contact information for care management team Advised patient, providing education and rationale, to  monitor blood pressure 3 x per week and record, calling PCP for findings outside established parameters.  Reviewed scheduled/upcoming provider appointments including: confirmed pt has no upcoming scheduled appointments Sent education via My Chart- low sodium diet Self-Care Activities:  Attends all scheduled provider appointments Calls provider office for new concerns, questions, or BP outside discussed parameters Checks BP and records as discussed Follows a low sodium diet/DASH diet Patient Goals: - check blood pressure 3 times per week - choose a place to take my blood pressure (home, clinic or office, retail store) - write blood pressure results in a log or diary  - please follow a low sodium diet - education sent via My Chart- low sodium - take all medications as prescribed - continue to exercise daily Follow Up Plan: Telephone follow up appointment with care management team member scheduled for:   11/29/2020     Plan:Telephone follow up appointment with care management team member scheduled for:  11/29/2020  Jacqlyn Larsen [facility], BSN RN Case Manager [facility] [phone]

## 2020-10-09 DIAGNOSIS — H40023 Open angle with borderline findings, high risk, bilateral: Secondary | ICD-10-CM | POA: Diagnosis not present

## 2020-11-22 ENCOUNTER — Encounter: Payer: Self-pay | Admitting: Family Medicine

## 2020-11-22 ENCOUNTER — Other Ambulatory Visit: Payer: Self-pay

## 2020-11-22 ENCOUNTER — Ambulatory Visit (INDEPENDENT_AMBULATORY_CARE_PROVIDER_SITE_OTHER): Payer: Medicare HMO | Admitting: Family Medicine

## 2020-11-22 VITALS — BP 130/74 | HR 70 | Temp 98.4°F | Resp 14 | Ht 68.0 in | Wt 194.0 lb

## 2020-11-22 DIAGNOSIS — S0502XA Injury of conjunctiva and corneal abrasion without foreign body, left eye, initial encounter: Secondary | ICD-10-CM

## 2020-11-22 MED ORDER — ERYTHROMYCIN 5 MG/GM OP OINT: 1.0000 "application " | TOPICAL_OINTMENT | Freq: Two times a day (BID) | OPHTHALMIC | 1 refills | Status: DC

## 2020-11-22 NOTE — Progress Notes (Signed)
Subjective:    Patient ID: Cody [name], male    DOB: 06-Oct-1950, 70 y.o.   MRN: [id number]  HPI Patient reports a 2-day history of a foreign body sensation in his left.  He states that he feels like there is something scratching his eyeball.  On examination, the conjunctiva of the left eye is slightly injected and erythematous.  Pupils are equal round reactive to light.  There is no visible foreign body seen.  I performed a fluorescein exam.  On fluorescein exam, there is a 1 mm round corneal abrasion at 5:00 from the pupil.  There is no dendrites.  There is increased tear production in the left eye.  He has been using lubricant drops. Past Medical History:  Diagnosis Date   Acute URI 11/08/2012   Allergy    Bradycardia    CVA (cerebral infarction)    Leukopenia    Migraine headache    Prediabetes    Statin intolerance 03/08/2018   Past Surgical History:  Procedure Laterality Date   SHOULDER SURGERY     right   Current Outpatient Medications on File Prior to Visit  Medication Sig Dispense Refill   aspirin 81 MG EC tablet Take by mouth.     fluticasone (FLONASE) 50 MCG/ACT nasal spray SPRAY 2 SPRAYS INTO EACH NOSTRIL EVERY DAY 48 g 3   losartan-hydrochlorothiazide (HYZAAR) 50-12.5 MG tablet TAKE 1 TABLET BY MOUTH EVERY DAY IN THE MORNING 90 tablet 3   Polyethyl Glycol-Propyl Glycol (SYSTANE FREE OP) Apply to eye.     pravastatin (PRAVACHOL) 10 MG tablet TAKE 1 TABLET EVERY DAY 90 tablet 3   sildenafil (VIAGRA) 100 MG tablet TAKE 1/2-1 TABLET BY MOUTH DAILY AS NEEDED FOR ERECTILE DYSFUNCTION 15 tablet 3   No current facility-administered medications on file prior to visit.   Allergies  Allergen Reactions   Codeine     Generalized itching    Statins    Social History   Socioeconomic History   Marital status: Married    Spouse name: Not on file   Number of children: Not on file   Years of education: Not on file   Highest education level: Not on file  Occupational  History   Not on file  Tobacco Use   Smoking status: Never   Smokeless tobacco: Never  Substance and Sexual Activity   Alcohol use: No   Drug use: No   Sexual activity: Not on file  Other Topics Concern   Not on file  Social History Narrative   Not on file   Social Determinants of Health   Financial Resource Strain: Not on file  Food Insecurity: No Food Insecurity   Worried About Running Out of Food in the Last Year: Never true   Ran Out of Food in the Last Year: Never true  Transportation Needs: No Transportation Needs   Lack of Transportation (Medical): No   Lack of Transportation (Non-Medical): No  Physical Activity: Not on file  Stress: Not on file  Social Connections: Not on file  Intimate Partner Violence: Not on file     Review of Systems  All other systems reviewed and are negative.     Objective:   Physical Exam Vitals reviewed.  Constitutional:      General: He is not in acute distress.    Appearance: Normal appearance. He is normal weight. He is not ill-appearing, toxic-appearing or diaphoretic.  HENT:     Head: Normocephalic and atraumatic.  Eyes:  General: Lids are normal. Lids are everted, no foreign bodies appreciated.     Extraocular Movements:     Right eye: Normal extraocular motion.     Left eye: Normal extraocular motion.     Conjunctiva/sclera:     Left eye: Left conjunctiva is injected. No exudate.  Neck:     Vascular: No JVD.  Cardiovascular:     Rate and Rhythm: Normal rate and regular rhythm.     Heart sounds: Murmur heard.    No friction rub. No gallop.  Pulmonary:     Effort: Pulmonary effort is normal. No respiratory distress.     Breath sounds: Normal breath sounds. No stridor. No wheezing, rhonchi or rales.  Chest:     Chest wall: No tenderness.  Neurological:     Mental Status: He is alert.          Assessment & Plan:  Abrasion of left cornea, initial encounter Patient has a corneal abrasion as diagrammed above.   Begin erythromycin ophthalmic ointment.  Apply 1/2 inch ribbon 2-3 times a day to the affected eye both as a lubricant and to prevent secondary infection.  Recheck here next week.  If the abrasion is developing into an ulceration he will need to see ophthalmology.  If healing appropriately no further work-up is necessary

## 2020-11-26 ENCOUNTER — Ambulatory Visit (INDEPENDENT_AMBULATORY_CARE_PROVIDER_SITE_OTHER): Payer: Medicare HMO | Admitting: Family Medicine

## 2020-11-26 ENCOUNTER — Other Ambulatory Visit: Payer: Self-pay

## 2020-11-26 VITALS — BP 112/62 | HR 67 | Temp 97.9°F | Ht 68.0 in | Wt 196.0 lb

## 2020-11-26 DIAGNOSIS — S0502XD Injury of conjunctiva and corneal abrasion without foreign body, left eye, subsequent encounter: Secondary | ICD-10-CM

## 2020-11-26 NOTE — Progress Notes (Signed)
Subjective:    Patient ID: Cody [name], male    DOB: 28-Jan-1951, 70 y.o.   MRN: [id number]  HPI 11/22/20 Patient reports a 2-day history of a foreign body sensation in his left.  He states that he feels like there is something scratching his eyeball.  On examination, the conjunctiva of the left eye is slightly injected and erythematous.  Pupils are equal round reactive to light.  There is no visible foreign body seen.  I performed a fluorescein exam.  On fluorescein exam, there is a 1 mm round corneal abrasion at 5:00 from the pupil.  There is no dendrites.  There is increased tear production in the left eye.  He has been using lubricant drops.  At that time, my plan was:  Patient has a corneal abrasion as diagrammed above.  Begin erythromycin ophthalmic ointment.  Apply 1/2 inch ribbon 2-3 times a day to the affected eye both as a lubricant and to prevent secondary infection.  Recheck here next week.  If the abrasion is developing into an ulceration he will need to see ophthalmology.  If healing appropriately no further work-up is necessary 11/26/20 Patient is here today for recheck.  He states that the pain in his left thigh has completely gone away.  There is no further foreign body sensation.  Has been consistently using the erythromycin ophthalmic ointment.  He denies any blurry vision or eye pain. Past Medical History:  Diagnosis Date   Acute URI 11/08/2012   Allergy    Bradycardia    CVA (cerebral infarction)    Leukopenia    Migraine headache    Prediabetes    Statin intolerance 03/08/2018   Past Surgical History:  Procedure Laterality Date   SHOULDER SURGERY     right   Current Outpatient Medications on File Prior to Visit  Medication Sig Dispense Refill   aspirin 81 MG EC tablet Take by mouth.     erythromycin ophthalmic ointment Place 1 application into the left eye in the morning and at bedtime. 3.5 g 1   fluticasone (FLONASE) 50 MCG/ACT nasal spray SPRAY 2 SPRAYS  INTO EACH NOSTRIL EVERY DAY 48 g 3   losartan-hydrochlorothiazide (HYZAAR) 50-12.5 MG tablet TAKE 1 TABLET BY MOUTH EVERY DAY IN THE MORNING 90 tablet 3   Polyethyl Glycol-Propyl Glycol (SYSTANE FREE OP) Apply to eye.     pravastatin (PRAVACHOL) 10 MG tablet TAKE 1 TABLET EVERY DAY 90 tablet 3   sildenafil (VIAGRA) 100 MG tablet TAKE 1/2-1 TABLET BY MOUTH DAILY AS NEEDED FOR ERECTILE DYSFUNCTION 15 tablet 3   No current facility-administered medications on file prior to visit.   Allergies  Allergen Reactions   Codeine     Generalized itching    Statins    Social History   Socioeconomic History   Marital status: Married    Spouse name: Not on file   Number of children: Not on file   Years of education: Not on file   Highest education level: Not on file  Occupational History   Not on file  Tobacco Use   Smoking status: Never   Smokeless tobacco: Never  Substance and Sexual Activity   Alcohol use: No   Drug use: No   Sexual activity: Not on file  Other Topics Concern   Not on file  Social History Narrative   Not on file   Social Determinants of Health   Financial Resource Strain: Not on file  Food Insecurity: No Food Insecurity  Worried About Programme researcher, broadcasting/film/video in the Last Year: Never true   [employer] of Food in the Last Year: Never true  Transportation Needs: No Transportation Needs   Lack of Transportation (Medical): No   Lack of Transportation (Non-Medical): No  Physical Activity: Not on file  Stress: Not on file  Social Connections: Not on file  Intimate Partner Violence: Not on file     Review of Systems  All other systems reviewed and are negative.     Objective:  Patient had a limited exam.  Lids were inverted and look for foreign bodies.  There were no foreign objects found.  There was no erythema or injection in the conjunctiva bilaterally.  Fluorescein exam was performed in the left eye and previously seen corneal abrasion has totally healed.  Vital  signs were evaluated.  Heart showed regular rate and rhythm with no murmurs rubs and gallops.  Pulmonary exam was clear to auscultation bilaterally without wheezes crackles or rails.        Assessment & Plan:  Abrasion of left cornea, subsequent encounter Corneal abrasion is completely healed with no ulcer formation.  Vision has not been affected.  No further follow-up is necessary for this issue.  He can discontinue erythromycin ophthalmic ointment

## 2020-11-29 ENCOUNTER — Telehealth: Payer: Medicare HMO

## 2021-03-04 ENCOUNTER — Other Ambulatory Visit: Payer: Self-pay | Admitting: Family Medicine

## 2021-03-04 DIAGNOSIS — I10 Essential (primary) hypertension: Secondary | ICD-10-CM

## 2021-03-07 ENCOUNTER — Other Ambulatory Visit: Payer: Self-pay

## 2021-03-07 ENCOUNTER — Ambulatory Visit (INDEPENDENT_AMBULATORY_CARE_PROVIDER_SITE_OTHER): Payer: Medicare HMO | Admitting: Family Medicine

## 2021-03-07 ENCOUNTER — Encounter: Payer: Self-pay | Admitting: Family Medicine

## 2021-03-07 VITALS — BP 122/78 | HR 62 | Temp 97.1°F | Resp 18 | Ht 68.0 in | Wt 196.0 lb

## 2021-03-07 DIAGNOSIS — M898X9 Other specified disorders of bone, unspecified site: Secondary | ICD-10-CM | POA: Diagnosis not present

## 2021-03-07 DIAGNOSIS — R52 Pain, unspecified: Secondary | ICD-10-CM | POA: Diagnosis not present

## 2021-03-07 NOTE — Progress Notes (Signed)
Subjective:    Patient ID: Cody [name], male    DOB: [birth year], 71 y.o.   MRN: [id number]  HPI  Patient is a very pleasant 71 year old African-American gentleman who presents today with weird migratory pains.  He states that is been going on for several months.  It occurs randomly for no reason.  He reports shooting pain going down his right arm.  It lasts a split second and then resolves.  He also will occasionally have shooting pains going down his left arm.  Occasionally he will have shooting pains going down his right leg and also down his left leg.  He denies any pain in his neck or in his back.  He has full range of motion in his neck.  He has full range of motion in his lumbar spine.  He denies any numbness or tingling in any extremity.  He denies any weakness in any extremity.  He has full range of motion in his right shoulder and in his left shoulder.  Hawkins test and empty can testing are negative bilaterally.  He has full flexion and extension and internal rotation and external rotation of both hips.  There is no pain in his knees or ankles.  He denies any fevers or chills.  He denies any night sweats.  He denies any weight loss.  He denies any bleeding or bruising. Past Medical History:  Diagnosis Date   Acute URI 11/08/2012   Allergy    Bradycardia    CVA (cerebral infarction)    Leukopenia    Migraine headache    Prediabetes    Statin intolerance 03/08/2018   Past Surgical History:  Procedure Laterality Date   SHOULDER SURGERY     right   Current Outpatient Medications on File Prior to Visit  Medication Sig Dispense Refill   aspirin 81 MG EC tablet Take by mouth.     fluticasone (FLONASE) 50 MCG/ACT nasal spray SPRAY 2 SPRAYS INTO EACH NOSTRIL EVERY DAY 48 g 3   losartan-hydrochlorothiazide (HYZAAR) 50-12.5 MG tablet TAKE 1 TABLET EVERY DAY IN THE MORNING 90 tablet 1   Polyethyl Glycol-Propyl Glycol (SYSTANE FREE OP) Apply to eye.     pravastatin (PRAVACHOL) 10  MG tablet TAKE 1 TABLET EVERY DAY 90 tablet 3   sildenafil (VIAGRA) 100 MG tablet TAKE 1/2-1 TABLET BY MOUTH DAILY AS NEEDED FOR ERECTILE DYSFUNCTION 15 tablet 3   No current facility-administered medications on file prior to visit.   Allergies  Allergen Reactions   Codeine     Generalized itching    Statins    Social History   Socioeconomic History   Marital status: Married    Spouse name: Not on file   Number of children: Not on file   Years of education: Not on file   Highest education level: Not on file  Occupational History   Not on file  Tobacco Use   Smoking status: Never   Smokeless tobacco: Never  Substance and Sexual Activity   Alcohol use: No   Drug use: No   Sexual activity: Not on file  Other Topics Concern   Not on file  Social History Narrative   Not on file   Social Determinants of Health   Financial Resource Strain: Not on file  Food Insecurity: No Food Insecurity   Worried About Running Out of Food in the Last Year: Never true   Ran Out of Food in the Last Year: Never true  Transportation Needs: No  Transportation Needs   Lack of Transportation (Medical): No   Lack of Transportation (Non-Medical): No  Physical Activity: Not on file  Stress: Not on file  Social Connections: Not on file  Intimate Partner Violence: Not on file     Review of Systems  All other systems reviewed and are negative.     Objective:   Physical Exam Vitals reviewed.  Constitutional:      General: He is not in acute distress.    Appearance: Normal appearance. He is normal weight. He is not ill-appearing, toxic-appearing or diaphoretic.  HENT:     Head: Normocephalic and atraumatic.  Neck:     Vascular: No carotid bruit or JVD.  Cardiovascular:     Rate and Rhythm: Normal rate and regular rhythm.     Heart sounds: Murmur heard.    No friction rub. No gallop.  Pulmonary:     Effort: Pulmonary effort is normal. No respiratory distress.     Breath sounds: Normal  breath sounds. No stridor. No wheezing, rhonchi or rales.  Chest:     Chest wall: No tenderness.  Musculoskeletal:     Right shoulder: Normal.     Left shoulder: No swelling, deformity, tenderness, bony tenderness or crepitus. Normal range of motion. Normal strength.     Cervical back: Normal range of motion and neck supple. No rigidity, spasms, tenderness or bony tenderness. No pain with movement. Normal range of motion.     Thoracic back: Normal. No bony tenderness. Normal range of motion.     Lumbar back: Normal. No spasms, tenderness or bony tenderness. Normal range of motion. Negative right straight leg raise test and negative left straight leg raise test.     Right lower leg: No edema.     Left lower leg: No edema.  Lymphadenopathy:     Cervical: No cervical adenopathy.  Skin:    General: Skin is warm.     Coloration: Skin is not jaundiced or pale.     Findings: No bruising, erythema, lesion or rash.  Neurological:     General: No focal deficit present.     Mental Status: He is alert and oriented to person, place, and time. Mental status is at baseline.     Cranial Nerves: No cranial nerve deficit.     Sensory: No sensory deficit.     Motor: No weakness.     Coordination: Coordination normal.     Gait: Gait normal.     Deep Tendon Reflexes: Reflexes normal.  Psychiatric:        Mood and Affect: Mood normal.        Behavior: Behavior normal.        Thought Content: Thought content normal.        Judgment: Judgment normal.          Assessment & Plan:  Bone pain - Plan: CBC with Differential/Platelet, COMPLETE METABOLIC PANEL WITH GFR, Sedimentation rate, CK, PSA, Protein electrophoresis, serum  Patient's history and exam does not suggest an obvious diagnosis.  The only condition that I can think of that would tie the pain together will be nerve impingement in the cervical spine potentially pressing on nerves radiating pain into his extremities.  The other potential would  be some weird neuropathic pain.  However the pain is extremely random, occurs for split-second and then goes away.  He denies any symptoms to suggest MS.  He denies any muscle weakness or loss of balance.  Other potential be some type  of bone pain such as multiple myeloma or metastatic brain cancer.  Therefore I will start by checking a sed rate, CBC, CMP especially the alkaline phosphatase, SPEP, PSA, and await the results of the lab work.  If labs are completely normal, I will reassure the patient that he likely has age-related aches and pains that are nonspecific.  I will also check a CK level to look for statin due to myopathy.

## 2021-03-08 ENCOUNTER — Encounter: Payer: Self-pay | Admitting: Family Medicine

## 2021-03-10 ENCOUNTER — Other Ambulatory Visit: Payer: Self-pay

## 2021-03-10 DIAGNOSIS — D649 Anemia, unspecified: Secondary | ICD-10-CM

## 2021-03-12 LAB — CBC WITH DIFFERENTIAL/PLATELET
Absolute Monocytes: 320 cells/uL (ref 200–950)
Basophils Absolute: 19 cells/uL (ref 0–200)
Basophils Relative: 0.6 %
Eosinophils Absolute: 42 cells/uL (ref 15–500)
Eosinophils Relative: 1.3 %
HCT: 34 % — ABNORMAL LOW (ref 38.5–50.0)
Hemoglobin: 11.3 g/dL — ABNORMAL LOW (ref 13.2–17.1)
Lymphs Abs: 1776 cells/uL (ref 850–3900)
MCH: 31 pg (ref 27.0–33.0)
MCHC: 33.2 g/dL (ref 32.0–36.0)
MCV: 93.4 fL (ref 80.0–100.0)
MPV: 11.3 fL (ref 7.5–12.5)
Monocytes Relative: 10 %
Neutro Abs: 1043 cells/uL — ABNORMAL LOW (ref 1500–7800)
Neutrophils Relative %: 32.6 %
Platelets: 189 10*3/uL (ref 140–400)
RBC: 3.64 10*6/uL — ABNORMAL LOW (ref 4.20–5.80)
RDW: 12.1 % (ref 11.0–15.0)
Total Lymphocyte: 55.5 %
WBC: 3.2 10*3/uL — ABNORMAL LOW (ref 3.8–10.8)

## 2021-03-12 LAB — PSA: PSA: 0.74 ng/mL (ref <=4.00)

## 2021-03-12 LAB — COMPLETE METABOLIC PANEL WITH GFR
AG Ratio: 1.6 (calc) (ref 1.0–2.5)
ALT: 14 U/L (ref 9–46)
AST: 20 U/L (ref 10–35)
Albumin: 4.1 g/dL (ref 3.6–5.1)
Alkaline phosphatase (APISO): 58 U/L (ref 35–144)
BUN: 17 mg/dL (ref 7–25)
CO2: 30 mmol/L (ref 20–32)
Calcium: 9.6 mg/dL (ref 8.6–10.3)
Chloride: 106 mmol/L (ref 98–110)
Creat: 1.19 mg/dL (ref 0.70–1.35)
Globulin: 2.6 g/dL (calc) (ref 1.9–3.7)
Glucose, Bld: 94 mg/dL (ref 65–99)
Potassium: 4.7 mmol/L (ref 3.5–5.3)
Sodium: 141 mmol/L (ref 135–146)
Total Bilirubin: 0.6 mg/dL (ref 0.2–1.2)
Total Protein: 6.7 g/dL (ref 6.1–8.1)
eGFR: 67 mL/min/{1.73_m2} (ref >=60)

## 2021-03-12 LAB — PROTEIN ELECTROPHORESIS, SERUM
Albumin ELP: 3.9 g/dL (ref 3.8–4.8)
Alpha 1: 0.3 g/dL (ref 0.2–0.3)
Alpha 2: 0.7 g/dL (ref 0.5–0.9)
Beta 2: 0.9 g/dL — ABNORMAL HIGH (ref 0.2–0.5)
Beta Globulin: 0.5 g/dL (ref 0.4–0.6)
Gamma Globulin: 0.5 g/dL — ABNORMAL LOW (ref 0.8–1.7)
Total Protein: 6.8 g/dL (ref 6.1–8.1)

## 2021-03-12 LAB — SEDIMENTATION RATE: Sed Rate: 9 mm/h (ref 0–20)

## 2021-03-12 LAB — CK: Total CK: 171 U/L (ref 44–196)

## 2021-04-05 ENCOUNTER — Other Ambulatory Visit: Payer: Self-pay | Admitting: Family Medicine

## 2021-04-18 ENCOUNTER — Other Ambulatory Visit: Payer: Self-pay | Admitting: Family Medicine

## 2021-05-26 ENCOUNTER — Emergency Department (HOSPITAL_BASED_OUTPATIENT_CLINIC_OR_DEPARTMENT_OTHER): Payer: Medicare HMO

## 2021-05-26 ENCOUNTER — Encounter (HOSPITAL_BASED_OUTPATIENT_CLINIC_OR_DEPARTMENT_OTHER): Payer: Self-pay

## 2021-05-26 ENCOUNTER — Emergency Department (HOSPITAL_BASED_OUTPATIENT_CLINIC_OR_DEPARTMENT_OTHER)
Admission: EM | Admit: 2021-05-26 | Discharge: 2021-05-26 | Disposition: A | Discharge status: Home / Self Care | Payer: Medicare HMO | Attending: Emergency Medicine | Admitting: Emergency Medicine

## 2021-05-26 ENCOUNTER — Other Ambulatory Visit: Payer: Self-pay

## 2021-05-26 DIAGNOSIS — Z79899 Other long term (current) drug therapy: Secondary | ICD-10-CM | POA: Insufficient documentation

## 2021-05-26 DIAGNOSIS — M79605 Pain in left leg: Secondary | ICD-10-CM | POA: Diagnosis not present

## 2021-05-26 DIAGNOSIS — E119 Type 2 diabetes mellitus without complications: Secondary | ICD-10-CM | POA: Diagnosis not present

## 2021-05-26 DIAGNOSIS — I10 Essential (primary) hypertension: Secondary | ICD-10-CM | POA: Diagnosis not present

## 2021-05-26 DIAGNOSIS — Z7982 Long term (current) use of aspirin: Secondary | ICD-10-CM | POA: Diagnosis not present

## 2021-05-26 DIAGNOSIS — Y92410 Unspecified street and highway as the place of occurrence of the external cause: Secondary | ICD-10-CM | POA: Insufficient documentation

## 2021-05-26 DIAGNOSIS — M79662 Pain in left lower leg: Secondary | ICD-10-CM | POA: Diagnosis not present

## 2021-05-26 MED ORDER — ACETAMINOPHEN 325 MG PO TABS: 650.0000 mg | ORAL_TABLET | Freq: Four times a day (QID) | ORAL | 0 refills | Status: DC | PRN

## 2021-05-26 MED ORDER — METHOCARBAMOL 500 MG PO TABS: 500.0000 mg | ORAL_TABLET | Freq: Two times a day (BID) | ORAL | 0 refills | Status: DC

## 2021-05-26 MED ORDER — IBUPROFEN 600 MG PO TABS: 600.0000 mg | ORAL_TABLET | Freq: Four times a day (QID) | ORAL | 0 refills | Status: DC | PRN

## 2021-05-26 MED ORDER — LIDOCAINE 5 % EX PTCH: 1.0000 | MEDICATED_PATCH | Freq: Every day | CUTANEOUS | 0 refills | Status: DC | PRN
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
## 2021-05-26 NOTE — Discharge Instructions (Signed)
It was a pleasure caring for you today in the emergency department. ° °Please return to the emergency department for any worsening or worrisome symptoms. ° ° °

## 2021-05-30 ENCOUNTER — Ambulatory Visit (INDEPENDENT_AMBULATORY_CARE_PROVIDER_SITE_OTHER): Payer: Medicare HMO | Admitting: Family Medicine

## 2021-05-30 VITALS — BP 120/82 | HR 83 | Temp 97.0°F | Ht 68.0 in | Wt 191.4 lb

## 2021-05-30 DIAGNOSIS — Z8673 Personal history of transient ischemic attack (TIA), and cerebral infarction without residual deficits: Secondary | ICD-10-CM | POA: Diagnosis not present

## 2021-05-30 DIAGNOSIS — E785 Hyperlipidemia, unspecified: Secondary | ICD-10-CM | POA: Diagnosis not present

## 2021-05-30 DIAGNOSIS — I10 Essential (primary) hypertension: Secondary | ICD-10-CM | POA: Diagnosis not present

## 2021-05-30 DIAGNOSIS — Z0000 Encounter for general adult medical examination without abnormal findings: Secondary | ICD-10-CM | POA: Diagnosis not present
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
## 2021-05-31 LAB — LIPID PANEL
Cholesterol: 151 mg/dL (ref <200)
HDL: 66 mg/dL (ref >=40)
LDL Cholesterol (Calc): 69 mg/dL (calc)
Non-HDL Cholesterol (Calc): 85 mg/dL (calc) (ref <130)
Total CHOL/HDL Ratio: 2.3 (calc) (ref <5.0)
Triglycerides: 78 mg/dL (ref <150)

## 2021-08-25 ENCOUNTER — Ambulatory Visit: Payer: Self-pay | Admitting: *Deleted

## 2021-08-25 DIAGNOSIS — E785 Hyperlipidemia, unspecified: Secondary | ICD-10-CM

## 2021-08-25 DIAGNOSIS — I10 Essential (primary) hypertension: Secondary | ICD-10-CM

## 2021-08-25 NOTE — Chronic Care Management (AMB) (Addendum)
   08/25/2021  NATAN HARTOG 12-Oct-1950 [id number]   Patient cancelled previously scheduled appointment, care plan updated and resolved.  Case closed.  Irving Shows RNC, BSN RN Case Manager [facility] [phone]

## 2021-09-24 ENCOUNTER — Other Ambulatory Visit: Payer: Self-pay | Admitting: Family Medicine

## 2021-09-24 NOTE — Telephone Encounter (Signed)
Requested Prescriptions  Pending Prescriptions Disp Refills  . pravastatin (PRAVACHOL) 10 MG tablet [Pharmacy Med Name: PRAVASTATIN SODIUM 10 MG Tablet] 90 tablet 0    Sig: TAKE 1 TABLET EVERY DAY     Cardiovascular:  Antilipid - Statins Failed - 09/24/2021  3:30 AM      Failed - Lipid Panel in normal range within the last 12 months    Cholesterol  Date Value Ref Range Status  05/30/2021 151 <200 mg/dL Final   LDL Cholesterol (Calc)  Date Value Ref Range Status  05/30/2021 69 mg/dL (calc) Final    Comment:    Reference range: <100 . Desirable range <100 mg/dL for primary prevention;   <70 mg/dL for patients with CHD or diabetic patients  with > or = 2 CHD risk factors. Marland Kitchen LDL-C is now calculated using the Martin-Hopkins  calculation, which is a validated novel method providing  better accuracy than the Friedewald equation in the  estimation of LDL-C.  Horald Pollen et al. Lenox Ahr. 4008;676(19): 2061-2068  (http://education.[facility].com/faq/FAQ164)    HDL  Date Value Ref Range Status  05/30/2021 66 > OR = 40 mg/dL Final   Triglycerides  Date Value Ref Range Status  05/30/2021 78 <150 mg/dL Final         Passed - Patient is not pregnant      Passed - Valid encounter within last 12 months    Recent Outpatient Visits          3 months ago Essential hypertension   [facility] Pickard, Priscille Heidelberg, MD   6 months ago Bone pain   [facility] Pickard, Priscille Heidelberg, MD   10 months ago Abrasion of left cornea, subsequent encounter   [facility] Pickard, Priscille Heidelberg, MD   10 months ago Abrasion of left cornea, initial encounter   [facility] Pickard, Priscille Heidelberg, MD   1 year ago Cervical radiculopathy   [facility] Pickard, Priscille Heidelberg, MD

## 2021-11-05 DIAGNOSIS — Z23 Encounter for immunization: Secondary | ICD-10-CM | POA: Diagnosis not present

## 2021-12-23 ENCOUNTER — Ambulatory Visit (INDEPENDENT_AMBULATORY_CARE_PROVIDER_SITE_OTHER): Payer: Medicare HMO | Admitting: Family Medicine

## 2021-12-23 VITALS — BP 120/70 | HR 84 | Ht 68.0 in | Wt 190.0 lb

## 2021-12-23 DIAGNOSIS — M25561 Pain in right knee: Secondary | ICD-10-CM | POA: Diagnosis not present

## 2021-12-23 MED ORDER — MELOXICAM 15 MG PO TABS: 15.0000 mg | ORAL_TABLET | Freq: Every day | ORAL | 0 refills | Status: DC

## 2021-12-23 NOTE — Progress Notes (Signed)
Subjective:    Patient ID: Cody [name], male    DOB: 1950-06-10, 71 y.o.   MRN: [id number]  Knee Pain  The incident occurred 2 days ago.  Patient reports a 1 week history of right knee pain.  The pain is located over the medial compartment and posteriorly.  He denies any falls or injuries.  He does have a slight effusion.  He reports pain with standing and walking.  He denies feeling any laxity to varus or valgus stress or instability in the knee.  On examination today he has no laxity to varus or valgus stress.  He has negative anterior and posterior drawer sign.  He has no pain with Apley grind.  There is a slight effusion.  There is no warmth or erythema Past Medical History:  Diagnosis Date   Acute URI 11/08/2012   Allergy    Bradycardia    CVA (cerebral infarction)    Leukopenia    Migraine headache    Prediabetes    Statin intolerance 03/08/2018   Past Surgical History:  Procedure Laterality Date   SHOULDER SURGERY     right   Current Outpatient Medications on File Prior to Visit  Medication Sig Dispense Refill   acetaminophen (TYLENOL) 325 MG tablet Take 2 tablets (650 mg total) by mouth every 6 (six) hours as needed. 36 tablet 0   aspirin 81 MG EC tablet Take by mouth.     COMIRNATY SUSP injection      fluticasone (FLONASE) 50 MCG/ACT nasal spray USE 2 SPRAYS INTO EACH NOSTRIL EVERY DAY 48 g 3   ibuprofen (ADVIL) 600 MG tablet Take 1 tablet (600 mg total) by mouth every 6 (six) hours as needed. 30 tablet 0   losartan-hydrochlorothiazide (HYZAAR) 50-12.5 MG tablet TAKE 1 TABLET EVERY DAY IN THE MORNING 90 tablet 1   pravastatin (PRAVACHOL) 10 MG tablet TAKE 1 TABLET EVERY DAY 90 tablet 0   sildenafil (VIAGRA) 100 MG tablet TAKE 1/2 TO 1 TABLET BY MOUTH DAILY AS NEEDED FOR ERECTILE DYSFUNCTION 15 tablet 3   No current facility-administered medications on file prior to visit.   Allergies  Allergen Reactions   Codeine     Generalized itching    Statins     Social History   Socioeconomic History   Marital status: Married    Spouse name: Not on file   Number of children: Not on file   Years of education: Not on file   Highest education level: Not on file  Occupational History   Not on file  Tobacco Use   Smoking status: Never   Smokeless tobacco: Never  Substance and Sexual Activity   Alcohol use: No   Drug use: No   Sexual activity: Not on file  Other Topics Concern   Not on file  Social History Narrative   Not on file   Social Determinants of Health   Financial Resource Strain: Not on file  Food Insecurity: No Food Insecurity (10/04/2020)   Hunger Vital Sign    Worried About Running Out of Food in the Last Year: Never true    Ran Out of Food in the Last Year: Never true  Transportation Needs: No Transportation Needs (10/04/2020)   PRAPARE - Hydrologist (Medical): No    Lack of Transportation (Non-Medical): No  Physical Activity: Not on file  Stress: Not on file  Social Connections: Not on file  Intimate Partner Violence: Not on file  Review of Systems  All other systems reviewed and are negative.      Objective:   Physical Exam Vitals reviewed.  Constitutional:      General: He is not in acute distress.    Appearance: Normal appearance. He is normal weight. He is not ill-appearing, toxic-appearing or diaphoretic.  HENT:     Head: Normocephalic and atraumatic.  Neck:     Vascular: No carotid bruit or JVD.  Cardiovascular:     Rate and Rhythm: Normal rate and regular rhythm.     Heart sounds: Murmur heard.     No friction rub. No gallop.  Pulmonary:     Effort: Pulmonary effort is normal. No respiratory distress.     Breath sounds: Normal breath sounds. No stridor. No wheezing, rhonchi or rales.  Chest:     Chest wall: No tenderness.  Musculoskeletal:     Right shoulder: Normal.     Left shoulder: No swelling, deformity, tenderness, bony tenderness or crepitus. Normal  range of motion. Normal strength.     Cervical back: Normal range of motion and neck supple. No rigidity, spasms, tenderness or bony tenderness. No pain with movement. Normal range of motion.     Thoracic back: Normal. No bony tenderness. Normal range of motion.     Lumbar back: Normal. No spasms, tenderness or bony tenderness. Normal range of motion. Negative right straight leg raise test and negative left straight leg raise test.     Right knee: Normal range of motion. Tenderness present over the medial joint line. No MCL, LCL, ACL or PCL tenderness. No LCL laxity, MCL laxity or ACL laxity. Normal meniscus.     Right lower leg: No edema.     Left lower leg: No edema.  Lymphadenopathy:     Cervical: No cervical adenopathy.  Skin:    General: Skin is warm.     Coloration: Skin is not jaundiced or pale.     Findings: No bruising, erythema, lesion or rash.  Neurological:     General: No focal deficit present.     Mental Status: He is alert and oriented to person, place, and time. Mental status is at baseline.     Cranial Nerves: No cranial nerve deficit.     Sensory: No sensory deficit.     Motor: No weakness.     Coordination: Coordination normal.     Gait: Gait normal.     Deep Tendon Reflexes: Reflexes normal.  Psychiatric:        Mood and Affect: Mood normal.        Behavior: Behavior normal.        Thought Content: Thought content normal.        Judgment: Judgment normal.           Assessment & Plan:  Acute pain of right knee I suspect the patient has osteoarthritis of the.  His exam does not suggest a meniscal tear.  Begin meloxicam 15 mg daily and recheck in 1 week.  If not improving, I will proceed with an x-ray of the right knee.  Patient declines a cortisone injection today and is willing to try the meloxicam because he feels like the knee is gradually getting better

## 2022-01-02 ENCOUNTER — Other Ambulatory Visit: Payer: Self-pay | Admitting: Family Medicine

## 2022-01-02 DIAGNOSIS — I10 Essential (primary) hypertension: Secondary | ICD-10-CM

## 2022-01-20 ENCOUNTER — Ambulatory Visit (INDEPENDENT_AMBULATORY_CARE_PROVIDER_SITE_OTHER): Payer: Medicare HMO | Admitting: Family Medicine

## 2022-01-20 VITALS — BP 128/72 | HR 69 | Ht 68.0 in | Wt 197.6 lb

## 2022-01-20 DIAGNOSIS — M25561 Pain in right knee: Secondary | ICD-10-CM

## 2022-01-20 NOTE — Progress Notes (Signed)
Subjective:    Patient ID: Cody [name], male    DOB: Dec 30, 1950, 71 y.o.   MRN: [id number]  Knee Pain  The incident occurred 2 days ago.  Patient reports a 1 week history of right knee pain.  The pain is located over the medial compartment and posteriorly.  He denies any falls or injuries.  He does have a slight effusion.  He reports pain with standing and walking.  He denies feeling any laxity to varus or valgus stress or instability in the knee.  On examination today he has no laxity to varus or valgus stress.  He has negative anterior and posterior drawer sign.  He has no pain with Apley grind.  There is a slight effusion.  There is no warmth or erythema.  At that time, my plan was:  I suspect the patient has osteoarthritis of the.  His exam does not suggest a meniscal tear.  Begin meloxicam 15 mg daily and recheck in 1 week.  If not improving, I will proceed with an x-ray of the right knee.  Patient declines a cortisone injection today and is willing to try the meloxicam because he feels like the knee is gradually getting better  01/20/22 Patient presents today continuing to complain of pain in his right knee.  The pain continues to be over the medial compartment and posterior medial compartment.  He reports a stabbing pain when he is walking.  He tried meloxicam for more than 2 weeks without any benefit.  He stopped the medication.  There is a slight effusion on exam today and he has tenderness to palpation over the medial joint line with pain with passive range of motion.  He is limping when he walks. Past Medical History:  Diagnosis Date   Acute URI 11/08/2012   Allergy    Bradycardia    CVA (cerebral infarction)    Leukopenia    Migraine headache    Prediabetes    Statin intolerance 03/08/2018   Past Surgical History:  Procedure Laterality Date   SHOULDER SURGERY     right   Current Outpatient Medications on File Prior to Visit  Medication Sig Dispense Refill    acetaminophen (TYLENOL) 325 MG tablet Take 2 tablets (650 mg total) by mouth every 6 (six) hours as needed. 36 tablet 0   AREXVY 120 MCG/0.5ML injection      aspirin 81 MG EC tablet Take by mouth.     COMIRNATY SUSP injection      fluticasone (FLONASE) 50 MCG/ACT nasal spray USE 2 SPRAYS INTO EACH NOSTRIL EVERY DAY 48 g 3   ibuprofen (ADVIL) 600 MG tablet Take 1 tablet (600 mg total) by mouth every 6 (six) hours as needed. 30 tablet 0   losartan-hydrochlorothiazide (HYZAAR) 50-12.5 MG tablet TAKE 1 TABLET EVERY DAY IN THE MORNING 90 tablet 1   pravastatin (PRAVACHOL) 10 MG tablet TAKE 1 TABLET EVERY DAY 90 tablet 0   sildenafil (VIAGRA) 100 MG tablet TAKE 1/2 TO 1 TABLET BY MOUTH DAILY AS NEEDED FOR ERECTILE DYSFUNCTION 15 tablet 3   No current facility-administered medications on file prior to visit.   Allergies  Allergen Reactions   Codeine     Generalized itching    Statins    Social History   Socioeconomic History   Marital status: Married    Spouse name: Not on file   Number of children: Not on file   Years of education: Not on file   Highest education level:  Not on file  Occupational History   Not on file  Tobacco Use   Smoking status: Never   Smokeless tobacco: Never  Substance and Sexual Activity   Alcohol use: No   Drug use: No   Sexual activity: Not on file  Other Topics Concern   Not on file  Social History Narrative   Not on file   Social Determinants of Health   Financial Resource Strain: Not on file  Food Insecurity: No Food Insecurity (10/04/2020)   Hunger Vital Sign    Worried About Running Out of Food in the Last Year: Never true    Ran Out of Food in the Last Year: Never true  Transportation Needs: No Transportation Needs (10/04/2020)   PRAPARE - Hydrologist (Medical): No    Lack of Transportation (Non-Medical): No  Physical Activity: Not on file  Stress: Not on file  Social Connections: Not on file  Intimate Partner  Violence: Not on file     Review of Systems  All other systems reviewed and are negative.      Objective:   Physical Exam Vitals reviewed.  Constitutional:      General: He is not in acute distress.    Appearance: Normal appearance. He is normal weight. He is not ill-appearing, toxic-appearing or diaphoretic.  HENT:     Head: Normocephalic and atraumatic.  Neck:     Vascular: No carotid bruit or JVD.  Cardiovascular:     Rate and Rhythm: Normal rate and regular rhythm.     Heart sounds: Murmur heard.     No friction rub. No gallop.  Pulmonary:     Effort: Pulmonary effort is normal. No respiratory distress.     Breath sounds: Normal breath sounds. No stridor. No wheezing, rhonchi or rales.  Chest:     Chest wall: No tenderness.  Musculoskeletal:     Right shoulder: Normal.     Left shoulder: No swelling, deformity, tenderness, bony tenderness or crepitus. Normal range of motion. Normal strength.     Cervical back: Normal range of motion and neck supple. No rigidity, spasms, tenderness or bony tenderness. No pain with movement. Normal range of motion.     Thoracic back: Normal. No bony tenderness. Normal range of motion.     Lumbar back: Normal. No spasms, tenderness or bony tenderness. Normal range of motion. Negative right straight leg raise test and negative left straight leg raise test.     Right knee: Normal range of motion. Tenderness present over the medial joint line. No MCL, LCL, ACL or PCL tenderness. No LCL laxity, MCL laxity or ACL laxity. Normal meniscus.     Right lower leg: No edema.     Left lower leg: No edema.  Lymphadenopathy:     Cervical: No cervical adenopathy.  Skin:    General: Skin is warm.     Coloration: Skin is not jaundiced or pale.     Findings: No bruising, erythema, lesion or rash.  Neurological:     General: No focal deficit present.     Mental Status: He is alert and oriented to person, place, and time. Mental status is at baseline.      Cranial Nerves: No cranial nerve deficit.     Sensory: No sensory deficit.     Motor: No weakness.     Coordination: Coordination normal.     Gait: Gait normal.     Deep Tendon Reflexes: Reflexes normal.  Psychiatric:  Mood and Affect: Mood normal.        Behavior: Behavior normal.        Thought Content: Thought content normal.        Judgment: Judgment normal.           Assessment & Plan:  Acute pain of right knee - Plan: DG Knee Complete 4 Views Right I suspect a meniscal tear.  Begin by obtaining an x-ray of the right knee.  Using sterile technique, I injected the right knee with 2 cc lidocaine, 2 cc of Marcaine, and 2 cc of 40 mg/mL Kenalog.  The patient tolerated the procedure well without complication.  If pain is not improving and x-ray is nondiagnostic we will proceed with an MRI next.  Hopefully the cortisone shot will help patient

## 2022-01-22 ENCOUNTER — Ambulatory Visit
Admission: RE | Admit: 2022-01-22 | Discharge: 2022-01-22 | Disposition: A | Discharge status: Home / Self Care | Payer: Medicare HMO | Source: Ambulatory Visit | Attending: Family Medicine | Admitting: Family Medicine

## 2022-01-22 DIAGNOSIS — M1711 Unilateral primary osteoarthritis, right knee: Secondary | ICD-10-CM | POA: Diagnosis not present

## 2022-01-22 DIAGNOSIS — M25561 Pain in right knee: Secondary | ICD-10-CM

## 2022-02-18 ENCOUNTER — Other Ambulatory Visit: Payer: Self-pay | Admitting: Family Medicine

## 2022-04-16 ENCOUNTER — Telehealth: Payer: Self-pay | Admitting: Family Medicine

## 2022-04-16 ENCOUNTER — Other Ambulatory Visit: Payer: Self-pay | Admitting: Family Medicine

## 2022-04-16 DIAGNOSIS — M25561 Pain in right knee: Secondary | ICD-10-CM

## 2022-04-16 NOTE — Telephone Encounter (Signed)
Patient's wife Levada Dy called to follow up on patient's ongoing R knee pain. Stated pain has worsened, and patient is requesting a referral to a specialist.  LOV 01/20/22.  Please advise at [phone]

## 2022-04-24 ENCOUNTER — Ambulatory Visit (INDEPENDENT_AMBULATORY_CARE_PROVIDER_SITE_OTHER): Payer: Medicare HMO | Admitting: Orthopaedic Surgery

## 2022-04-24 DIAGNOSIS — M25561 Pain in right knee: Secondary | ICD-10-CM

## 2022-04-24 DIAGNOSIS — M1711 Unilateral primary osteoarthritis, right knee: Secondary | ICD-10-CM

## 2022-04-24 MED ORDER — TRIAMCINOLONE ACETONIDE 40 MG/ML IJ SUSP
80.0000 mg | INTRAMUSCULAR | Status: AC | PRN
  Administered 2022-04-24: 80 mg via INTRA_ARTICULAR

## 2022-04-24 MED ORDER — LIDOCAINE HCL 1 % IJ SOLN
4.0000 mL | INTRAMUSCULAR | Status: AC | PRN
  Administered 2022-04-24: 4 mL

## 2022-04-24 NOTE — Progress Notes (Signed)
Chief Complaint: Right knee pain     History of Present Illness:    Cody [name] is a 72 y.o. male presents today with right knee pain which has been ongoing now for the last several months.  He states this is on and off but worse with walking.  He does currently work delivering medications to pharmacist.  States that the pain is predominantly medial.  Denies any mechanical symptoms.  Denies any specific injury to the knee.    Surgical History:   none  PMH/PSH/Family History/Social History/Meds/Allergies:    Past Medical History:  Diagnosis Date  . Acute URI 11/08/2012  . Allergy   . Bradycardia   . CVA (cerebral infarction)   . Leukopenia   . Migraine headache   . Prediabetes   . Statin intolerance 03/08/2018   Past Surgical History:  Procedure Laterality Date  . SHOULDER SURGERY     right   Social History   Socioeconomic History  . Marital status: Married    Spouse name: Not on file  . Number of children: Not on file  . Years of education: Not on file  . Highest education level: Not on file  Occupational History  . Not on file  Tobacco Use  . Smoking status: Never  . Smokeless tobacco: Never  Substance and Sexual Activity  . Alcohol use: No  . Drug use: No  . Sexual activity: Not on file  Other Topics Concern  . Not on file  Social History Narrative  . Not on file   Social Determinants of Health   Financial Resource Strain: Not on file  Food Insecurity: No Food Insecurity (10/04/2020)   Hunger Vital Sign   . Worried About Charity fundraiser in the Last Year: Never true   . Ran Out of Food in the Last Year: Never true  Transportation Needs: No Transportation Needs (10/04/2020)   PRAPARE - Transportation   . Lack of Transportation (Medical): No   . Lack of Transportation (Non-Medical): No  Physical Activity: Not on file  Stress: Not on file  Social Connections: Not on file   No family history on  file. Allergies  Allergen Reactions  . Codeine     Generalized itching   . Statins    Current Outpatient Medications  Medication Sig Dispense Refill  . acetaminophen (TYLENOL) 325 MG tablet Take 2 tablets (650 mg total) by mouth every 6 (six) hours as needed. 36 tablet 0  . AREXVY 120 MCG/0.5ML injection     . aspirin 81 MG EC tablet Take by mouth.    . COMIRNATY SUSP injection     . fluticasone (FLONASE) 50 MCG/ACT nasal spray USE 2 SPRAYS INTO EACH NOSTRIL EVERY DAY 48 g 3  . ibuprofen (ADVIL) 600 MG tablet Take 1 tablet (600 mg total) by mouth every 6 (six) hours as needed. 30 tablet 0  . losartan-hydrochlorothiazide (HYZAAR) 50-12.5 MG tablet TAKE 1 TABLET EVERY DAY IN THE MORNING 90 tablet 1  . pravastatin (PRAVACHOL) 10 MG tablet TAKE 1 TABLET EVERY DAY 90 tablet 3  . sildenafil (VIAGRA) 100 MG tablet TAKE 1/2 TO 1 TABLET BY MOUTH DAILY AS NEEDED FOR ERECTILE DYSFUNCTION 15 tablet 3   No current facility-administered medications for this visit.   No results found.  Review of Systems:  A ROS was performed including pertinent positives and negatives as documented in the HPI.  Physical Exam :   Constitutional: NAD and appears stated age Neurological: Alert and oriented Psych: Appropriate affect and cooperative There were no vitals taken for this visit.   Comprehensive Musculoskeletal Exam:    Right knee tenderness to palpation about the medial tibiofemoral joint.  There is no effusion.  Range of motion is from 0 to 130 degrees.  Negative Lachman  Imaging:   Xray (4 views right knee): Medial joint space narrowing consistent with very mild osteoarthritis   I personally reviewed and interpreted the radiographs.   Assessment:   73 y.o. male with right knee pain consistent with very mild osteoarthritis.  At today's visit I do believe he would quite significantly benefit from an ultrasound-guided injection of the right knee.  He would like to proceed with this today.  I  will plan to see him back on an as-needed basis  Plan :    -Right knee ultrasound-guided injection performed     Procedure Note  Patient: Cody [name]             Date of Birth: [birth year]           MRN: [id number]             Visit Date: 04/24/2022  Procedures: Visit Diagnoses: No diagnosis found.  Large Joint Inj: R knee on 04/24/2022 1:51 PM Indications: pain Details: 22 G 1.5 in needle, ultrasound-guided anterior approach  Arthrogram: No  Medications: 4 mL lidocaine 1 %; 80 mg triamcinolone acetonide 40 MG/ML Outcome: tolerated well, no immediate complications Procedure, treatment alternatives, risks and benefits explained, specific risks discussed. Consent was given by the patient. Immediately prior to procedure a time out was called to verify the correct patient, procedure, equipment, support staff and site/side marked as required. Patient was prepped and draped in the usual sterile fashion.         I personally saw and evaluated the patient, and participated in the management and treatment plan.  Vanetta Mulders, MD Attending Physician, Orthopedic Surgery  This document was dictated using Dragon voice recognition software. A reasonable attempt at proof reading has been made to minimize errors.

## 2022-05-12 ENCOUNTER — Ambulatory Visit: Payer: Medicare HMO | Admitting: Family Medicine

## 2022-05-28 ENCOUNTER — Other Ambulatory Visit: Payer: Self-pay | Admitting: Family Medicine

## 2022-05-28 NOTE — Telephone Encounter (Signed)
Requested medication (s) are due for refill today: yes  Requested medication (s) are on the active medication list: yes  Last refill:  04/08/21 #48g/3  Future visit scheduled: no  Notes to clinic:  pt due for CPE and PEC not allowed to schedule.      Requested Prescriptions  Pending Prescriptions Disp Refills   fluticasone (FLONASE) 50 MCG/ACT nasal spray [Pharmacy Med Name: FLUTICASONE PROPIONATE 50 MCG/ACT Suspension] 48 g 3    Sig: USE 2 SPRAYS IN EACH NOSTRIL EVERY DAY     Ear, Nose, and Throat: Nasal Preparations - Corticosteroids Failed - 05/28/2022  4:07 AM      Failed - Valid encounter within last 12 months    Recent Outpatient Visits           12 months ago Essential hypertension   [facility] Pickard, Priscille Heidelberg, MD   1 year ago Bone pain   [facility] Tanya Nones, Priscille Heidelberg, MD   1 year ago Abrasion of left cornea, subsequent encounter   [facility] Donita Brooks, MD   1 year ago Abrasion of left cornea, initial encounter   [facility] Pickard, Priscille Heidelberg, MD   1 year ago Cervical radiculopathy   [facility] Pickard, Priscille Heidelberg, MD

## 2022-06-03 ENCOUNTER — Telehealth: Payer: Self-pay | Admitting: Family Medicine

## 2022-06-03 NOTE — Telephone Encounter (Signed)
Called patient to schedule Medicare Annual Wellness Visit (AWV). No voicemail available to leave a message.  Last date of AWV:  05/30/2021    Please schedule an appointment at any time with Toni Amend, [facility] .  If any questions, please contact me at [phone].  Thank you,  Judeth Cornfield,  AMB Clinical Support [facility] AWV Program Direct Dial ??[phone]

## 2022-06-30 ENCOUNTER — Encounter: Payer: Self-pay | Admitting: Family Medicine

## 2022-06-30 ENCOUNTER — Ambulatory Visit (INDEPENDENT_AMBULATORY_CARE_PROVIDER_SITE_OTHER): Payer: Medicare HMO | Admitting: Family Medicine

## 2022-06-30 VITALS — BP 112/68 | HR 57 | Temp 98.3°F | Ht 68.0 in | Wt 183.8 lb

## 2022-06-30 DIAGNOSIS — R002 Palpitations: Secondary | ICD-10-CM

## 2022-06-30 DIAGNOSIS — R0602 Shortness of breath: Secondary | ICD-10-CM | POA: Diagnosis not present

## 2022-06-30 DIAGNOSIS — R5383 Other fatigue: Secondary | ICD-10-CM

## 2022-06-30 DIAGNOSIS — R0789 Other chest pain: Secondary | ICD-10-CM | POA: Diagnosis not present

## 2022-06-30 NOTE — Progress Notes (Signed)
Subjective:    Patient ID: Cody [name], male    DOB: 1951-02-09, 72 y.o.   MRN: [id number]  HPI   Patient presents today complaining of profound fatigue.  He reports frequent palpitations.  He states that his heart starts racing for no reason.  He also reports shortness of breath with activity and occasionally at rest.  He also reports atypical tightness of the right side of his chest that occurs randomly unprovoked by exercise.  Echocardiogram in 2019 did showed left ventricular hypertrophy but normal ejection fraction.  He denies any classic angina.  Today's blood pressure is well-controlled at 112/68.  EKG shows sinus bradycardia with first-degree AV block.  Outside of that there is no evidence of ischemia or infarction.  Past Medical History:  Diagnosis Date   Acute URI 11/08/2012   Allergy    Bradycardia    CVA (cerebral infarction)    Leukopenia    Migraine headache    Prediabetes    Statin intolerance 03/08/2018   Past Surgical History:  Procedure Laterality Date   SHOULDER SURGERY     right   Current Outpatient Medications on File Prior to Visit  Medication Sig Dispense Refill   acetaminophen (TYLENOL) 325 MG tablet Take 2 tablets (650 mg total) by mouth every 6 (six) hours as needed. 36 tablet 0   AREXVY 120 MCG/0.5ML injection      aspirin 81 MG EC tablet Take by mouth.     COMIRNATY SUSP injection      fluticasone (FLONASE) 50 MCG/ACT nasal spray USE 2 SPRAYS IN EACH NOSTRIL EVERY DAY 48 g 3   ibuprofen (ADVIL) 600 MG tablet Take 1 tablet (600 mg total) by mouth every 6 (six) hours as needed. 30 tablet 0   losartan-hydrochlorothiazide (HYZAAR) 50-12.5 MG tablet TAKE 1 TABLET EVERY DAY IN THE MORNING 90 tablet 1   pravastatin (PRAVACHOL) 10 MG tablet TAKE 1 TABLET EVERY DAY 90 tablet 3   sildenafil (VIAGRA) 100 MG tablet TAKE 1/2 TO 1 TABLET BY MOUTH DAILY AS NEEDED FOR ERECTILE DYSFUNCTION 15 tablet 3   No current facility-administered medications on file  prior to visit.   Allergies  Allergen Reactions   Codeine     Generalized itching    Statins    Social History   Socioeconomic History   Marital status: Married    Spouse name: Not on file   Number of children: Not on file   Years of education: Not on file   Highest education level: Not on file  Occupational History   Not on file  Tobacco Use   Smoking status: Never   Smokeless tobacco: Never  Substance and Sexual Activity   Alcohol use: No   Drug use: No   Sexual activity: Not on file  Other Topics Concern   Not on file  Social History Narrative   Not on file   Social Determinants of Health   Financial Resource Strain: Not on file  Food Insecurity: No Food Insecurity (10/04/2020)   Hunger Vital Sign    Worried About Running Out of Food in the Last Year: Never true    Ran Out of Food in the Last Year: Never true  Transportation Needs: No Transportation Needs (10/04/2020)   PRAPARE - Administrator, Civil Service (Medical): No    Lack of Transportation (Non-Medical): No  Physical Activity: Not on file  Stress: Not on file  Social Connections: Not on file  Intimate Partner Violence: Not  on file   No family history on file.    Review of Systems  All other systems reviewed and are negative.      Objective:   Physical Exam Vitals reviewed.  Constitutional:      General: He is not in acute distress.    Appearance: He is well-developed. He is not diaphoretic.  HENT:     Head: Normocephalic and atraumatic.     Right Ear: External ear normal.     Left Ear: External ear normal.     Nose: Nose normal.     Mouth/Throat:     Pharynx: No oropharyngeal exudate.  Eyes:     General: No scleral icterus.       Right eye: No discharge.        Left eye: No discharge.     Conjunctiva/sclera: Conjunctivae normal.     Pupils: Pupils are equal, round, and reactive to light.  Neck:     Thyroid: No thyromegaly.     Vascular: No JVD.     Trachea: No tracheal  deviation.  Cardiovascular:     Rate and Rhythm: Normal rate and regular rhythm.     Heart sounds: Murmur heard.     No friction rub. No gallop.  Pulmonary:     Effort: Pulmonary effort is normal. No respiratory distress.     Breath sounds: Normal breath sounds. No stridor. No wheezing or rales.  Chest:     Chest wall: No tenderness.  Abdominal:     General: Bowel sounds are normal. There is no distension.     Palpations: Abdomen is soft. There is no mass.     Tenderness: There is no abdominal tenderness. There is no guarding or rebound.  Musculoskeletal:        General: No tenderness or deformity. Normal range of motion.     Cervical back: Normal range of motion and neck supple.  Lymphadenopathy:     Cervical: No cervical adenopathy.  Skin:    General: Skin is warm.     Coloration: Skin is not pale.     Findings: No erythema or rash.  Neurological:     Mental Status: He is alert and oriented to person, place, and time.     Cranial Nerves: No cranial nerve deficit.     Motor: No abnormal muscle tone.     Coordination: Coordination normal.     Deep Tendon Reflexes: Reflexes are normal and symmetric.  Psychiatric:        Behavior: Behavior normal.        Thought Content: Thought content normal.        Judgment: Judgment normal.          Assessment & Plan:  Shortness of breath - Plan: EKG 12-Lead, Brain natriuretic peptide  Fatigue, unspecified type - Plan: CBC with Differential/Platelet, COMPLETE METABOLIC PANEL WITH GFR, TSH, Vitamin B12, Testosterone Total,Free,Bio, Males  Palpitations - Plan: CBC with Differential/Platelet, Brain natriuretic peptide, COMPLETE METABOLIC PANEL WITH GFR, TSH  Atypical chest pain Start with baseline lab work.  Check BNP.  Check CBC CMP TSH vitamin B12 and testosterone level.  Consult cardiology for stress test given his shortness of breath, atypical chest pain.  EKG is nonspecific but does show sinus bradycardia but I do not believe that  this is sufficient to cause his fatigue

## 2022-07-01 LAB — TESTOSTERONE TOTAL,FREE,BIO, MALES
Albumin: 4.1 g/dL (ref 3.6–5.1)
Sex Hormone Binding: 81 nmol/L — ABNORMAL HIGH (ref 22–77)
Testosterone, Bioavailable: 79.6 ng/dL (ref 15.0–150.0)
Testosterone, Free: 42.3 pg/mL (ref 6.0–73.0)
Testosterone: 677 ng/dL (ref 250–827)

## 2022-07-01 LAB — COMPLETE METABOLIC PANEL WITH GFR
AG Ratio: 1.6 (calc) (ref 1.0–2.5)
ALT: 13 U/L (ref 9–46)
AST: 15 U/L (ref 10–35)
Albumin: 4.1 g/dL (ref 3.6–5.1)
Alkaline phosphatase (APISO): 59 U/L (ref 35–144)
BUN: 25 mg/dL (ref 7–25)
CO2: 26 mmol/L (ref 20–32)
Calcium: 9.2 mg/dL (ref 8.6–10.3)
Chloride: 105 mmol/L (ref 98–110)
Creat: 1.22 mg/dL (ref 0.70–1.28)
Globulin: 2.5 g/dL (calc) (ref 1.9–3.7)
Glucose, Bld: 126 mg/dL — ABNORMAL HIGH (ref 65–99)
Potassium: 4.6 mmol/L (ref 3.5–5.3)
Sodium: 140 mmol/L (ref 135–146)
Total Bilirubin: 0.5 mg/dL (ref 0.2–1.2)
Total Protein: 6.6 g/dL (ref 6.1–8.1)
eGFR: 64 mL/min/{1.73_m2} (ref >=60)

## 2022-07-01 LAB — CBC WITH DIFFERENTIAL/PLATELET
Absolute Monocytes: 368 cells/uL (ref 200–950)
Basophils Absolute: 40 cells/uL (ref 0–200)
Basophils Relative: 1 %
Eosinophils Absolute: 48 cells/uL (ref 15–500)
Eosinophils Relative: 1.2 %
HCT: 39.1 % (ref 38.5–50.0)
Hemoglobin: 12.9 g/dL — ABNORMAL LOW (ref 13.2–17.1)
Lymphs Abs: 1796 cells/uL (ref 850–3900)
MCH: 31.3 pg (ref 27.0–33.0)
MCHC: 33 g/dL (ref 32.0–36.0)
MCV: 94.9 fL (ref 80.0–100.0)
MPV: 11.2 fL (ref 7.5–12.5)
Monocytes Relative: 9.2 %
Neutro Abs: 1748 cells/uL (ref 1500–7800)
Neutrophils Relative %: 43.7 %
Platelets: 203 10*3/uL (ref 140–400)
RBC: 4.12 10*6/uL — ABNORMAL LOW (ref 4.20–5.80)
RDW: 12.1 % (ref 11.0–15.0)
Total Lymphocyte: 44.9 %
WBC: 4 10*3/uL (ref 3.8–10.8)

## 2022-07-01 LAB — BRAIN NATRIURETIC PEPTIDE: Brain Natriuretic Peptide: 42 pg/mL (ref <100)

## 2022-07-01 LAB — TSH: TSH: 0.8 mIU/L (ref 0.40–4.50)

## 2022-07-01 LAB — VITAMIN B12: Vitamin B-12: 1131 pg/mL — ABNORMAL HIGH (ref 200–1100)

## 2022-07-09 ENCOUNTER — Encounter: Payer: Self-pay | Admitting: Family Medicine

## 2022-07-09 ENCOUNTER — Ambulatory Visit (INDEPENDENT_AMBULATORY_CARE_PROVIDER_SITE_OTHER): Payer: Medicare HMO | Admitting: Family Medicine

## 2022-07-09 VITALS — BP 120/72 | HR 91 | Temp 98.1°F | Ht 68.0 in | Wt 180.8 lb

## 2022-07-09 DIAGNOSIS — R002 Palpitations: Secondary | ICD-10-CM | POA: Diagnosis not present

## 2022-07-09 DIAGNOSIS — Z1211 Encounter for screening for malignant neoplasm of colon: Secondary | ICD-10-CM

## 2022-07-09 DIAGNOSIS — Z8673 Personal history of transient ischemic attack (TIA), and cerebral infarction without residual deficits: Secondary | ICD-10-CM

## 2022-07-09 DIAGNOSIS — I10 Essential (primary) hypertension: Secondary | ICD-10-CM

## 2022-07-09 DIAGNOSIS — Z125 Encounter for screening for malignant neoplasm of prostate: Secondary | ICD-10-CM | POA: Diagnosis not present

## 2022-07-09 DIAGNOSIS — E785 Hyperlipidemia, unspecified: Secondary | ICD-10-CM | POA: Diagnosis not present

## 2022-07-09 DIAGNOSIS — Z0000 Encounter for general adult medical examination without abnormal findings: Secondary | ICD-10-CM

## 2022-07-09 MED ORDER — LOSARTAN POTASSIUM-HCTZ 50-12.5 MG PO TABS
ORAL_TABLET | ORAL | 3 refills | Status: DC
Start: 2022-07-09 — End: 2023-06-17

## 2022-07-09 NOTE — Progress Notes (Signed)
Subjective:    Patient ID: Cody [name], male    DOB: 11/24/1950, 72 y.o.   MRN: [id number]  HPI  Patient is a very pleasant 72 year old African-American gentleman here today for complete physical exam.  He has a remote history of a right periventricular caudate infarct on MRI in 2016.  This was found as part of a work-up for memory loss.  Patient reports palpitations.  He states that he feels like his heart may be skipping a beat.  He denies any chest pain shortness of breath or dyspnea on exertion.  He is due for a colonoscopy this year.  He is also due for prostate cancer screening.  Reviewing his immunization records below show that all of his immunizations are up-to-date. Immunization History  Administered Date(s) Administered   Fluad Quad(high Dose 65+) 10/26/2018, 11/05/2021   Influenza, High Dose Seasonal PF 11/11/2017, 12/08/2019, 11/06/2020   Influenza,inj,Quad PF,6+ Mos 11/29/2016   Influenza-Unspecified 11/16/2015, 10/26/2018   PFIZER(Purple Top)SARS-COV-2 Vaccination 04/19/2019, 05/10/2019, 12/08/2019, 06/10/2020   Pfizer Covid-19 Vaccine Bivalent Booster 70yrs & up 11/06/2020   Pneumococcal Conjugate-13 02/25/2018   Pneumococcal Polysaccharide-23 03/06/2019   Td 05/18/2006   Tdap 06/07/2006, 06/12/2016   Zoster Recombinat (Shingrix) 06/04/2020, 08/15/2020   Zoster, Live 07/29/2012   Patient recently had blood work.  This was nonfasting.  Lab work was significant for mild anemia, normal testosterone level, normal thyroid test, blood sugar slightly elevated but this was nonfasting.  Otherwise CMP was normal Office Visit on 06/30/2022  Component Date Value Ref Range Status   WBC 06/30/2022 4.0  3.8 - 10.8 Thousand/uL Final   RBC 06/30/2022 4.12 (L)  4.20 - 5.80 Million/uL Final   Hemoglobin 06/30/2022 12.9 (L)  13.2 - 17.1 g/dL Final   HCT 40/98/1191 39.1  38.5 - 50.0 % Final   MCV 06/30/2022 94.9  80.0 - 100.0 fL Final   MCH 06/30/2022 31.3  27.0 - 33.0 pg Final    MCHC 06/30/2022 33.0  32.0 - 36.0 g/dL Final   RDW 47/82/9562 12.1  11.0 - 15.0 % Final   Platelets 06/30/2022 203  140 - 400 Thousand/uL Final   MPV 06/30/2022 11.2  7.5 - 12.5 fL Final   Neutro Abs 06/30/2022 1,748  1,500 - 7,800 cells/uL Final   Lymphs Abs 06/30/2022 1,796  850 - 3,900 cells/uL Final   Absolute Monocytes 06/30/2022 368  200 - 950 cells/uL Final   Eosinophils Absolute 06/30/2022 48  15 - 500 cells/uL Final   Basophils Absolute 06/30/2022 40  0 - 200 cells/uL Final   Neutrophils Relative % 06/30/2022 43.7  % Final   Total Lymphocyte 06/30/2022 44.9  % Final   Monocytes Relative 06/30/2022 9.2  % Final   Eosinophils Relative 06/30/2022 1.2  % Final   Basophils Relative 06/30/2022 1.0  % Final   Brain Natriuretic Peptide 06/30/2022 42  <100 pg/mL Final   Comment: . BNP levels increase with age in the general population with the highest values seen in individuals greater than 70 years of age. Reference: J. Am. Ladon Applebaum. Cardiol. 2002; 13:086-578. Marland Kitchen    Glucose, Bld 06/30/2022 126 (H)  65 - 99 mg/dL Final   Comment: .            Fasting reference interval . For someone without known diabetes, a glucose value >125 mg/dL indicates that they may have diabetes and this should be confirmed with a follow-up test. .    BUN 06/30/2022 25  7 - 25 mg/dL Final  Creat 06/30/2022 1.22  0.70 - 1.28 mg/dL Final   eGFR 04/54/0981 64  > OR = 60 mL/min/1.68m2 Final   BUN/Creatinine Ratio 06/30/2022 SEE NOTE:  6 - 22 (calc) Final   Comment:    Not Reported: BUN and Creatinine are within    reference range. .    Sodium 06/30/2022 140  135 - 146 mmol/L Final   Potassium 06/30/2022 4.6  3.5 - 5.3 mmol/L Final   Chloride 06/30/2022 105  98 - 110 mmol/L Final   CO2 06/30/2022 26  20 - 32 mmol/L Final   Calcium 06/30/2022 9.2  8.6 - 10.3 mg/dL Final   Total Protein 19/14/7829 6.6  6.1 - 8.1 g/dL Final   Albumin 56/21/3086 4.1  3.6 - 5.1 g/dL Final   Globulin 57/84/6962 2.5  1.9 - 3.7  g/dL (calc) Final   AG Ratio 06/30/2022 1.6  1.0 - 2.5 (calc) Final   Total Bilirubin 06/30/2022 0.5  0.2 - 1.2 mg/dL Final   Alkaline phosphatase (APISO) 06/30/2022 59  35 - 144 U/L Final   AST 06/30/2022 15  10 - 35 U/L Final   ALT 06/30/2022 13  9 - 46 U/L Final   TSH 06/30/2022 0.80  0.40 - 4.50 mIU/L Final   Vitamin B-12 06/30/2022 1,131 (H)  200 - 1,100 pg/mL Final   Testosterone 06/30/2022 677  250 - 827 ng/dL Final   Albumin 95/28/4132 4.1  3.6 - 5.1 g/dL Final   Sex Hormone Binding 06/30/2022 81 (H)  22 - 77 nmol/L Final   Testosterone, Free 06/30/2022 42.3  6.0 - 73.0 pg/mL Final   Testosterone, Bioavailable 06/30/2022 79.6  15.0 - 150.0 ng/dL Final    Past Medical History:  Diagnosis Date   Acute URI 11/08/2012   Allergy    Bradycardia    CVA (cerebral infarction)    Leukopenia    Migraine headache    Prediabetes    Statin intolerance 03/08/2018   Past Surgical History:  Procedure Laterality Date   SHOULDER SURGERY     right   Current Outpatient Medications on File Prior to Visit  Medication Sig Dispense Refill   aspirin 81 MG EC tablet Take by mouth.     COMIRNATY SUSP injection      fluticasone (FLONASE) 50 MCG/ACT nasal spray USE 2 SPRAYS IN EACH NOSTRIL EVERY DAY 48 g 3   ibuprofen (ADVIL) 600 MG tablet Take 1 tablet (600 mg total) by mouth every 6 (six) hours as needed. 30 tablet 0   losartan-hydrochlorothiazide (HYZAAR) 50-12.5 MG tablet TAKE 1 TABLET EVERY DAY IN THE MORNING 90 tablet 1   pravastatin (PRAVACHOL) 10 MG tablet TAKE 1 TABLET EVERY DAY 90 tablet 3   sildenafil (VIAGRA) 100 MG tablet TAKE 1/2 TO 1 TABLET BY MOUTH DAILY AS NEEDED FOR ERECTILE DYSFUNCTION 15 tablet 3   No current facility-administered medications on file prior to visit.   Allergies  Allergen Reactions   Codeine     Generalized itching    Statins    Social History   Socioeconomic History   Marital status: Married    Spouse name: Not on file   Number of children: Not on  file   Years of education: Not on file   Highest education level: Not on file  Occupational History   Not on file  Tobacco Use   Smoking status: Never   Smokeless tobacco: Never  Substance and Sexual Activity   Alcohol use: No   Drug use: No  Sexual activity: Not on file  Other Topics Concern   Not on file  Social History Narrative   Not on file   Social Determinants of Health   Financial Resource Strain: Not on file  Food Insecurity: No Food Insecurity (10/04/2020)   Hunger Vital Sign    Worried About Running Out of Food in the Last Year: Never true    Ran Out of Food in the Last Year: Never true  Transportation Needs: No Transportation Needs (10/04/2020)   PRAPARE - Administrator, Civil Service (Medical): No    Lack of Transportation (Non-Medical): No  Physical Activity: Not on file  Stress: Not on file  Social Connections: Not on file  Intimate Partner Violence: Not on file   No family history on file.    Review of Systems  All other systems reviewed and are negative.      Objective:   Physical Exam Vitals reviewed.  Constitutional:      General: He is not in acute distress.    Appearance: He is well-developed. He is not diaphoretic.  HENT:     Head: Normocephalic and atraumatic.     Right Ear: External ear normal.     Left Ear: External ear normal.     Nose: Nose normal.     Mouth/Throat:     Pharynx: No oropharyngeal exudate.  Eyes:     General: No scleral icterus.       Right eye: No discharge.        Left eye: No discharge.     Conjunctiva/sclera: Conjunctivae normal.     Pupils: Pupils are equal, round, and reactive to light.  Neck:     Thyroid: No thyromegaly.     Vascular: No JVD.     Trachea: No tracheal deviation.  Cardiovascular:     Rate and Rhythm: Normal rate and regular rhythm.     Heart sounds: Murmur heard.     No friction rub. No gallop.  Pulmonary:     Effort: Pulmonary effort is normal. No respiratory distress.      Breath sounds: Normal breath sounds. No stridor. No wheezing or rales.  Chest:     Chest wall: No tenderness.  Abdominal:     General: Bowel sounds are normal. There is no distension.     Palpations: Abdomen is soft. There is no mass.     Tenderness: There is no abdominal tenderness. There is no guarding or rebound.  Musculoskeletal:        General: No tenderness or deformity. Normal range of motion.     Cervical back: Normal range of motion and neck supple.  Lymphadenopathy:     Cervical: No cervical adenopathy.  Skin:    General: Skin is warm.     Coloration: Skin is not pale.     Findings: No erythema or rash.  Neurological:     Mental Status: He is alert and oriented to person, place, and time.     Cranial Nerves: No cranial nerve deficit.     Motor: No abnormal muscle tone.     Coordination: Coordination normal.     Deep Tendon Reflexes: Reflexes are normal and symmetric.  Psychiatric:        Behavior: Behavior normal.        Thought Content: Thought content normal.        Judgment: Judgment normal.          Assessment & Plan:   General medical exam  H/O: CVA (cerebrovascular accident) - Plan: COMPLETE METABOLIC PANEL WITH GFR, Lipid panel  Hyperlipidemia, unspecified hyperlipidemia type - Plan: COMPLETE METABOLIC PANEL WITH GFR, Lipid panel  Essential hypertension  Prostate cancer screening  Palpitations - Plan: EKG 12-Lead  Colon cancer screening - Plan: Ambulatory referral to Gastroenterology Given the palpitations,, check an EKG.  I believe the patient is likely experiencing occasional PVCs.  EKG shows normal sinus rhythm with a first-degree block.  There is Q waves in the inferior leads however this is an old finding that been present for the last 5 years.  At that time he had an echocardiogram which showed no structural damage or wall motion abnormalities.  There is no significant change in his EKG from 2 weeks ago as well as when compared for more than 5  years ago.  I believe the patient is likely having PVCs however I would appreciate cardiology evaluation.  Patient states the shortness of breath is only when he has the palpitations.  I believe the patient would benefit from a Holter monitor.  I will schedule the patient for colonoscopy.  I will check a PSA to screen for prostate cancer.  I will check a fasting lipid panel.  Goal LDL cholesterol is less than 55 given his history of stroke.  The patient is fasting so we will repeat a CMP to look at his blood sugar.  His blood sugar is normal no further workup is necessary.  Immunizations are up-to-date.

## 2022-07-10 LAB — COMPLETE METABOLIC PANEL WITH GFR
AG Ratio: 1.6 (calc) (ref 1.0–2.5)
ALT: 14 U/L (ref 9–46)
AST: 18 U/L (ref 10–35)
Albumin: 4.1 g/dL (ref 3.6–5.1)
Alkaline phosphatase (APISO): 63 U/L (ref 35–144)
BUN/Creatinine Ratio: 15 (calc) (ref 6–22)
BUN: 23 mg/dL (ref 7–25)
CO2: 25 mmol/L (ref 20–32)
Calcium: 9.5 mg/dL (ref 8.6–10.3)
Chloride: 104 mmol/L (ref 98–110)
Creat: 1.54 mg/dL — ABNORMAL HIGH (ref 0.70–1.28)
Globulin: 2.6 g/dL (calc) (ref 1.9–3.7)
Glucose, Bld: 75 mg/dL (ref 65–99)
Potassium: 3.9 mmol/L (ref 3.5–5.3)
Sodium: 141 mmol/L (ref 135–146)
Total Bilirubin: 1.1 mg/dL (ref 0.2–1.2)
Total Protein: 6.7 g/dL (ref 6.1–8.1)
eGFR: 48 mL/min/{1.73_m2} — ABNORMAL LOW (ref >=60)

## 2022-07-10 LAB — LIPID PANEL
Cholesterol: 166 mg/dL (ref <200)
HDL: 86 mg/dL (ref >=40)
LDL Cholesterol (Calc): 66 mg/dL (calc)
Non-HDL Cholesterol (Calc): 80 mg/dL (calc) (ref <130)
Total CHOL/HDL Ratio: 1.9 (calc) (ref <5.0)
Triglycerides: 48 mg/dL (ref <150)

## 2022-07-16 ENCOUNTER — Encounter: Payer: Self-pay | Admitting: *Deleted

## 2022-07-28 ENCOUNTER — Telehealth: Payer: Self-pay | Admitting: *Deleted

## 2022-07-28 NOTE — Telephone Encounter (Signed)
  Procedure: Colonoscopy  Height: 5'8 Weight: 180lbs        Have you had a colonoscopy before?  12/23/12, in epic, [facility]  Do you have family history of colon cancer?  no  Do you have a family history of polyps? no  Previous colonoscopy with polyps removed? no  Do you have a history colorectal cancer?   no  Are you diabetic?  no  Do you have a prosthetic or mechanical heart valve? no  Do you have a pacemaker/defibrillator?   no  Have you had endocarditis/atrial fibrillation?  no  Do you use supplemental oxygen/CPAP?  no  Have you had joint replacement within the last 12 months?  no  Do you tend to be constipated or have to use laxatives?  no   Do you have history of alcohol use? If yes, how much and how often.  no  Do you have history or are you using drugs? If yes, what do are you  using?  no  Have you ever had a stroke/heart attack?  no  Have you ever had a heart or other vascular stent placed,?no  Do you take weight loss medication? no  Do you take any blood-thinning medications such as: (Plavix, aspirin, Coumadin, Aggrenox, Brilinta, Xarelto, Eliquis, Pradaxa, Savaysa or Effient)? Aspirin 81mg   If yes we need the name, milligram, dosage and who is prescribing doctor:               Current Outpatient Medications  Medication Sig Dispense Refill   aspirin 81 MG EC tablet Take by mouth.     losartan-hydrochlorothiazide (HYZAAR) 50-12.5 MG tablet TAKE 1 TABLET EVERY DAY IN THE MORNING 90 tablet 3   pravastatin (PRAVACHOL) 10 MG tablet TAKE 1 TABLET EVERY DAY 90 tablet 3   fluticasone (FLONASE) 50 MCG/ACT nasal spray USE 2 SPRAYS IN EACH NOSTRIL EVERY DAY 48 g 3   ibuprofen (ADVIL) 600 MG tablet Take 1 tablet (600 mg total) by mouth every 6 (six) hours as needed. 30 tablet 0   sildenafil (VIAGRA) 100 MG tablet TAKE 1/2 TO 1 TABLET BY MOUTH DAILY AS NEEDED FOR ERECTILE DYSFUNCTION 15 tablet 3   No current facility-administered medications for this visit.     Allergies  Allergen Reactions   Codeine     Generalized itching    Statins

## 2022-08-12 ENCOUNTER — Encounter: Payer: Self-pay | Admitting: Physical Therapy

## 2022-08-12 ENCOUNTER — Ambulatory Visit (INDEPENDENT_AMBULATORY_CARE_PROVIDER_SITE_OTHER): Payer: Medicare HMO | Admitting: Physical Therapy

## 2022-08-12 ENCOUNTER — Telehealth (HOSPITAL_BASED_OUTPATIENT_CLINIC_OR_DEPARTMENT_OTHER): Payer: Self-pay | Admitting: Orthopaedic Surgery

## 2022-08-12 ENCOUNTER — Ambulatory Visit (INDEPENDENT_AMBULATORY_CARE_PROVIDER_SITE_OTHER): Payer: Medicare HMO

## 2022-08-12 ENCOUNTER — Ambulatory Visit (HOSPITAL_BASED_OUTPATIENT_CLINIC_OR_DEPARTMENT_OTHER): Payer: Medicare HMO | Admitting: Orthopaedic Surgery

## 2022-08-12 DIAGNOSIS — M1711 Unilateral primary osteoarthritis, right knee: Secondary | ICD-10-CM | POA: Diagnosis not present

## 2022-08-12 DIAGNOSIS — M25511 Pain in right shoulder: Secondary | ICD-10-CM

## 2022-08-12 DIAGNOSIS — M19011 Primary osteoarthritis, right shoulder: Secondary | ICD-10-CM | POA: Diagnosis not present

## 2022-08-12 MED ORDER — TRIAMCINOLONE ACETONIDE 40 MG/ML IJ SUSP
80.0000 mg | INTRAMUSCULAR | Status: AC | PRN
Start: 2022-08-12 — End: 2022-08-12
  Administered 2022-08-12: 80 mg via INTRA_ARTICULAR

## 2022-08-12 MED ORDER — LIDOCAINE HCL 1 % IJ SOLN
4.0000 mL | INTRAMUSCULAR | Status: AC | PRN
Start: 2022-08-12 — End: 2022-08-12
  Administered 2022-08-12: 4 mL

## 2022-08-12 NOTE — Telephone Encounter (Signed)
Gel injection 

## 2022-08-12 NOTE — Therapy (Signed)
  OUTPATIENT PHYSICAL THERAPY SCREEN @[address]   Patient Name: Cody [name] MRN: [id number] DOB:[birth year], 72 y.o., male Today's Date: 08/12/2022  END OF SESSION:  PT End of Session - 08/12/22 1434     Visit Number 1    Activity Tolerance Patient tolerated treatment well    Behavior During Therapy [facility] for tasks assessed/performed             Past Medical History:  Diagnosis Date   Acute URI 11/08/2012   Allergy    Bradycardia    CVA (cerebral infarction)    Leukopenia    Migraine headache    Prediabetes    Statin intolerance 03/08/2018   Past Surgical History:  Procedure Laterality Date   SHOULDER SURGERY     right   Patient Active Problem List   Diagnosis Date Noted   Migraine headache 08/05/2010     THERAPY DIAG:  Acute pain of right shoulder  Goal of screen:  This patient was referred to Physical Therapy specialty screen by Huel Cote, MD for HEP.   Medbridge HEP code:  [email or web address]  Clinical Impression & Plan:  Pt with significant cervical, thoracic and shoulder tightness. Pt reports h/o migraines and significant tension without treatment. Tactile assist and cues required for proper scapular retraction motion. Post impingement with external rotation during low angle pec stretch. I do think he would benfit from skilled PT to address tension and improve shoulder mechanics to reduce risk of further injury.    Army Fossa PT, DPT 08/12/2022, 2:34 PM  [address] [place], [place] [address] [phone]   Note: charges not applied for screen.

## 2022-08-12 NOTE — Progress Notes (Signed)
Chief Complaint: Right knee pain     History of Present Illness:   08/12/2022: Presents today for follow-up of his right shoulder as well as right knee.  He did get several months of good relief from his knee injection.  He does state that when he was 19 he had right shoulder instability surgery after dislocation.  This subsequently required a prolonged period of casting.  He states that he is having lateral based shoulder pain for the last several months.  Cody [name] is a 72 y.o. male presents today with right knee pain which has been ongoing now for the last several months.  He states this is on and off but worse with walking.  He does currently work delivering medications to pharmacist.  States that the pain is predominantly medial.  Denies any mechanical symptoms.  Denies any specific injury to the knee.    Surgical History:   none  PMH/PSH/Family History/Social History/Meds/Allergies:    Past Medical History:  Diagnosis Date   Acute URI 11/08/2012   Allergy    Bradycardia    CVA (cerebral infarction)    Leukopenia    Migraine headache    Prediabetes    Statin intolerance 03/08/2018   Past Surgical History:  Procedure Laterality Date   SHOULDER SURGERY     right   Social History   Socioeconomic History   Marital status: Married    Spouse name: Not on file   Number of children: Not on file   Years of education: Not on file   Highest education level: Not on file  Occupational History   Not on file  Tobacco Use   Smoking status: Never   Smokeless tobacco: Never  Substance and Sexual Activity   Alcohol use: No   Drug use: No   Sexual activity: Not on file  Other Topics Concern   Not on file  Social History Narrative   Not on file   Social Determinants of Health   Financial Resource Strain: Not on file  Food Insecurity: No Food Insecurity (10/04/2020)   Hunger Vital Sign    Worried About Running Out of Food in the  Last Year: Never true    Ran Out of Food in the Last Year: Never true  Transportation Needs: No Transportation Needs (10/04/2020)   PRAPARE - Administrator, Civil Service (Medical): No    Lack of Transportation (Non-Medical): No  Physical Activity: Not on file  Stress: Not on file  Social Connections: Not on file   No family history on file. Allergies  Allergen Reactions   Codeine     Generalized itching    Statins    Current Outpatient Medications  Medication Sig Dispense Refill   aspirin 81 MG EC tablet Take by mouth.     fluticasone (FLONASE) 50 MCG/ACT nasal spray USE 2 SPRAYS IN EACH NOSTRIL EVERY DAY 48 g 3   ibuprofen (ADVIL) 600 MG tablet Take 1 tablet (600 mg total) by mouth every 6 (six) hours as needed. 30 tablet 0   losartan-hydrochlorothiazide (HYZAAR) 50-12.5 MG tablet TAKE 1 TABLET EVERY DAY IN THE MORNING 90 tablet 3   pravastatin (PRAVACHOL) 10 MG tablet TAKE 1 TABLET EVERY DAY 90 tablet 3   sildenafil (VIAGRA) 100 MG tablet TAKE 1/2 TO 1 TABLET BY  MOUTH DAILY AS NEEDED FOR ERECTILE DYSFUNCTION 15 tablet 3   No current facility-administered medications for this visit.   No results found.  Review of Systems:   A ROS was performed including pertinent positives and negatives as documented in the HPI.  Physical Exam :   Constitutional: NAD and appears stated age Neurological: Alert and oriented Psych: Appropriate affect and cooperative There were no vitals taken for this visit.   Comprehensive Musculoskeletal Exam:    Right knee tenderness to palpation about the medial tibiofemoral joint.  There is no effusion.  Range of motion is from 0 to 130 degrees.  Negative Lachman  Right shoulder with tenderness palpation about the lateral deltoid as well as the glenohumeral joint.  Overhead range of motion is to 170 degrees equal to the contralateral side with external rotation at side to 50 degrees internal rotation is to L1.   Imaging:   Xray (4 views  right knee): Medial joint space narrowing consistent with very mild osteoarthritis   I personally reviewed and interpreted the radiographs.   Assessment:   72 y.o. male with right knee pain consistent with very mild osteoarthritis as well as right shoulder rotator cuff tendinitis as well as early degenerative findings.  At today's visit I did describe that we could begin with initial right shoulder ultrasound-guided steroid injection.  Given the fact that he only got mild relief from his right knee injection we did discuss the possibility of hyaluronic acid injection.  He would like to pursue this.  We did provide a right shoulder injection as well after verbal consent was obtained  Plan :    -Right glenohumeral ultrasound-guided injection performed after verbal consent obtained     Procedure Note  Patient: Cody [name]             Date of Birth: 21-Jul-1950           MRN: [id number]             Visit Date: 08/12/2022  Procedures: Visit Diagnoses:  1. Unilateral primary osteoarthritis, right knee   2. Acute pain of right shoulder     Large Joint Inj: R glenohumeral on 08/12/2022 3:04 PM Details: 22 G 1.5 in needle, ultrasound-guided anterior approach  Arthrogram: No  Medications: 4 mL lidocaine 1 %; 80 mg triamcinolone acetonide 40 MG/ML Outcome: tolerated well, no immediate complications Procedure, treatment alternatives, risks and benefits explained, specific risks discussed. Consent was given by the patient. Immediately prior to procedure a time out was called to verify the correct patient, procedure, equipment, support staff and site/side marked as required. Patient was prepped and draped in the usual sterile fashion.          I personally saw and evaluated the patient, and participated in the management and treatment plan.  Huel Cote, MD Attending Physician, Orthopedic Surgery  This document was dictated using Dragon voice recognition software. A  reasonable attempt at proof reading has been made to minimize errors.

## 2022-08-12 NOTE — Telephone Encounter (Signed)
Submitted for VOB for Monovisc right knee  

## 2022-08-13 NOTE — Telephone Encounter (Signed)
Holding for April. Unsure how to proceed with PA

## 2022-08-13 NOTE — Telephone Encounter (Signed)
VOB received  $1O Copay Covered at 100% once OOP is met PA required

## 2022-08-14 ENCOUNTER — Encounter: Payer: Self-pay | Admitting: Internal Medicine

## 2022-08-14 ENCOUNTER — Ambulatory Visit: Payer: Medicare HMO | Attending: Internal Medicine | Admitting: Internal Medicine

## 2022-08-14 ENCOUNTER — Encounter: Payer: Self-pay | Admitting: *Deleted

## 2022-08-14 VITALS — BP 116/72 | HR 81 | Ht 68.0 in | Wt 181.0 lb

## 2022-08-14 DIAGNOSIS — E7849 Other hyperlipidemia: Secondary | ICD-10-CM | POA: Diagnosis not present

## 2022-08-14 DIAGNOSIS — R079 Chest pain, unspecified: Secondary | ICD-10-CM

## 2022-08-14 NOTE — Progress Notes (Unsigned)
Cardiology Office Note  Date: 08/14/2022   ID: BASHAWN KRAUSHAAR, DOB November 29, 1950, MRN [id number]  PCP:  Donita Brooks, MD  Cardiologist:  Marjo Bicker, MD Electrophysiologist:  None   Reason for Office Visit: No chief complaint on file.   History of Present Illness: RAVIS PINGITORE is a 72 y.o. male  Past Medical History:  Diagnosis Date   Acute URI 11/08/2012   Allergy    Bradycardia    CVA (cerebral infarction)    Leukopenia    Migraine headache    Prediabetes    Statin intolerance 03/08/2018    Past Surgical History:  Procedure Laterality Date   SHOULDER SURGERY     right    Current Outpatient Medications  Medication Sig Dispense Refill   aspirin 81 MG EC tablet Take by mouth.     fluticasone (FLONASE) 50 MCG/ACT nasal spray USE 2 SPRAYS IN EACH NOSTRIL EVERY DAY 48 g 3   losartan-hydrochlorothiazide (HYZAAR) 50-12.5 MG tablet TAKE 1 TABLET EVERY DAY IN THE MORNING 90 tablet 3   pravastatin (PRAVACHOL) 10 MG tablet TAKE 1 TABLET EVERY DAY 90 tablet 3   No current facility-administered medications for this visit.   Allergies:  Codeine and Statins   Social History: The patient  reports that he has never smoked. He has never used smokeless tobacco. He reports that he does not drink alcohol and does not use drugs.   Family History: The patient's family history includes Diabetes in his mother; Hyperlipidemia in his mother; Hypertension in his mother.   ROS:  Please see the history of present illness. Otherwise, complete review of systems is positive for none.  All other systems are reviewed and negative.   Physical Exam: VS:  BP 116/72   Pulse 81   Ht 5\' 8"  (1.727 m)   Wt 181 lb (82.1 kg)   SpO2 96%   BMI 27.52 kg/m , BMI Body mass index is 27.52 kg/m.  Wt Readings from Last 3 Encounters:  08/14/22 181 lb (82.1 kg)  07/09/22 180 lb 12.8 oz (82 kg)  06/30/22 183 lb 12.8 oz (83.4 kg)    General: Patient appears comfortable at  rest. HEENT: Conjunctiva and lids normal, oropharynx clear with moist mucosa. Neck: Supple, no elevated JVP or carotid bruits, no thyromegaly. Lungs: Clear to auscultation, nonlabored breathing at rest. Cardiac: Regular rate and rhythm, no S3 or significant systolic murmur, no pericardial rub. Abdomen: Soft, nontender, no hepatomegaly, bowel sounds present, no guarding or rebound. Extremities: No pitting edema, distal pulses 2+. Skin: Warm and dry. Musculoskeletal: No kyphosis. Neuropsychiatric: Alert and oriented x3, affect grossly appropriate.  Recent Labwork: 06/30/2022: Brain Natriuretic Peptide 42; Hemoglobin 12.9; Platelets 203; TSH 0.80 07/09/2022: ALT 14; AST 18; BUN 23; Creat 1.54; Potassium 3.9; Sodium 141     Component Value Date/Time   CHOL 166 07/09/2022 1510   TRIG 48 07/09/2022 1510   HDL 86 07/09/2022 1510   CHOLHDL 1.9 07/09/2022 1510   VLDL 22 06/18/2016 0842   LDLCALC 66 07/09/2022 1510    Other Studies Reviewed Today:   Assessment and Plan:  72 year old#  I have spent a total of 45 minutes with patient reviewing chart, EKGs, labs and examining patient as well as establishing an assessment and plan that was discussed with the patient.  > 50% of time was spent in direct patient care.    Medication Adjustments/Labs and Tests Ordered: Current medicines are reviewed at length with the patient today.  Concerns regarding  medicines are outlined above.   Tests Ordered: Orders Placed This Encounter  Procedures   NM Myocar Multi W/Spect W/Wall Motion / EF   ECHOCARDIOGRAM COMPLETE    Medication Changes: No orders of the defined types were placed in this encounter.   Disposition:  Follow up  pending results  Signed, Da Authement Verne Spurr, MD, 08/14/2022 5:14 PM    [facility] at [facility] [address] [place], [place] [address]

## 2022-08-14 NOTE — Patient Instructions (Addendum)
Medication Instructions:  Your physician recommends that you continue on your current medications as directed. Please refer to the Current Medication list given to you today.  Labwork: none  Testing/Procedures: Your physician has requested that you have an echocardiogram. Echocardiography is a painless test that uses sound waves to create images of your heart. It provides your doctor with information about the size and shape of your heart and how well your heart's chambers and valves are working. This procedure takes approximately one hour. There are no restrictions for this procedure. Please do NOT wear cologne, perfume, aftershave, or lotions (deodorant is allowed). Please arrive 15 minutes prior to your appointment time. Your physician has requested that you have en exercise stress myoview. For further information please visit [email or web address]. Please follow instruction sheet, as given.  Follow-Up: Your physician recommends that you schedule a follow-up appointment in: pending  Any Other Special Instructions Will Be Listed Below (If Applicable).  If you need a refill on your cardiac medications before your next appointment, please call your pharmacy. 

## 2022-08-17 ENCOUNTER — Other Ambulatory Visit: Payer: Self-pay

## 2022-08-17 DIAGNOSIS — M1711 Unilateral primary osteoarthritis, right knee: Secondary | ICD-10-CM

## 2022-08-17 NOTE — Telephone Encounter (Signed)
Called and scheduled

## 2022-08-17 NOTE — Telephone Encounter (Signed)
No PA is required for Monovisc, right knee per [facility] Reference# [id number]  Patient can be scheduled.

## 2022-08-18 ENCOUNTER — Encounter (HOSPITAL_BASED_OUTPATIENT_CLINIC_OR_DEPARTMENT_OTHER): Payer: Self-pay | Admitting: Student

## 2022-08-18 ENCOUNTER — Ambulatory Visit (HOSPITAL_BASED_OUTPATIENT_CLINIC_OR_DEPARTMENT_OTHER): Payer: Medicare HMO | Admitting: Student

## 2022-08-18 DIAGNOSIS — M1711 Unilateral primary osteoarthritis, right knee: Secondary | ICD-10-CM

## 2022-08-18 MED ORDER — HYALURONAN 88 MG/4ML IX SOSY
88.0000 mg | PREFILLED_SYRINGE | INTRA_ARTICULAR | Status: AC | PRN
Start: 2022-08-18 — End: 2022-08-18
  Administered 2022-08-18: 88 mg via INTRA_ARTICULAR

## 2022-08-18 MED ORDER — HYALURONAN 88 MG/4ML IX SOSY
88.0000 mg | PREFILLED_SYRINGE | Freq: Once | INTRA_ARTICULAR | Status: AC
Start: 2022-08-18 — End: ?

## 2022-08-18 NOTE — Progress Notes (Signed)
   Procedure Note  Patient: ABDURRAHMAN [name]             Date of Birth: 1950/04/30           MRN: [id number]             Visit Date: 08/18/2022  HPI: Patient presents today for first Monovisc injection.  His last cortisone injection of the right knee was on 04/24/2022 and he reports getting mild relief.  Physical Exam: Full range of motion of the right knee from 0 to 130 degrees.  There is a trace effusion.  Positive medial joint line tenderness.  No erythema or warmth present.  Procedures: Visit Diagnoses:  1. Unilateral primary osteoarthritis, right knee     Large Joint Inj: R knee on 08/18/2022 2:43 PM Indications: pain Details: 22 G 1.5 in needle, ultrasound-guided anterolateral approach Medications: 88 mg Hyaluronan 88 MG/4ML Outcome: tolerated well, no immediate complications Consent was given by the patient. Immediately prior to procedure a time out was called to verify the correct patient, procedure, equipment, support staff and site/side marked as required. Patient was prepped and draped in the usual sterile fashion.     Hazle Nordmann, PA-C Orthopedics  This document was dictated using Conservation officer, historic buildings. A reasonable attempt at proof reading has been made to minimize errors.

## 2022-08-19 DIAGNOSIS — R079 Chest pain, unspecified: Secondary | ICD-10-CM | POA: Insufficient documentation

## 2022-08-19 DIAGNOSIS — E785 Hyperlipidemia, unspecified: Secondary | ICD-10-CM | POA: Insufficient documentation

## 2022-08-21 ENCOUNTER — Ambulatory Visit: Payer: Medicare HMO | Admitting: Internal Medicine

## 2022-08-24 ENCOUNTER — Ambulatory Visit (HOSPITAL_COMMUNITY)
Admission: RE | Admit: 2022-08-24 | Discharge: 2022-08-24 | Disposition: A | Discharge status: Home / Self Care | Payer: Medicare HMO | Source: Ambulatory Visit | Attending: Family Medicine | Admitting: Family Medicine

## 2022-08-24 ENCOUNTER — Encounter (HOSPITAL_COMMUNITY)
Admission: RE | Admit: 2022-08-24 | Discharge: 2022-08-24 | Disposition: A | Discharge status: Home / Self Care | Payer: Medicare HMO | Source: Ambulatory Visit | Attending: Internal Medicine | Admitting: Internal Medicine

## 2022-08-24 DIAGNOSIS — R079 Chest pain, unspecified: Secondary | ICD-10-CM | POA: Diagnosis not present

## 2022-08-24 LAB — NM MYOCAR MULTI W/SPECT W/WALL MOTION / EF
Angina Index: 0
Duke Treadmill Score: 7
Estimated workload: 9.6
Exercise duration (min): 6 min
Exercise duration (sec): 51 s
LV dias vol: 106 mL (ref 62–150)
LV sys vol: 50 mL
MPHR: 150 {beats}/min
Nuc Stress EF: 55 %
Peak HR: 155 {beats}/min
Percent HR: 103 %
RATE: 0.3
RPE: 15
Rest HR: 45 {beats}/min
Rest Nuclear Isotope Dose: 10.1 mCi
SDS: 2
SRS: 0
SSS: 2
ST Depression (mm): 0 mm
Stress Nuclear Isotope Dose: 30 mCi
TID: 0.94

## 2022-08-24 MED ORDER — TECHNETIUM TC 99M TETROFOSMIN IV KIT
10.0000 | PACK | Freq: Once | INTRAVENOUS | Status: AC | PRN
  Administered 2022-08-24: 10.1 via INTRAVENOUS

## 2022-08-24 MED ORDER — TECHNETIUM TC 99M TETROFOSMIN IV KIT
30.0000 | PACK | Freq: Once | INTRAVENOUS | Status: AC | PRN
  Administered 2022-08-24: 30 via INTRAVENOUS

## 2022-08-24 MED ORDER — SODIUM CHLORIDE FLUSH 0.9 % IV SOLN
INTRAVENOUS | Status: AC
  Administered 2022-08-24: 10 mL via INTRAVENOUS
  Filled 2022-08-24: qty 10

## 2022-08-24 MED ORDER — REGADENOSON 0.4 MG/5ML IV SOLN
INTRAVENOUS | Status: AC
  Filled 2022-08-24: qty 5

## 2022-09-01 ENCOUNTER — Telehealth: Payer: Self-pay

## 2022-09-01 ENCOUNTER — Other Ambulatory Visit: Payer: Self-pay

## 2022-09-01 DIAGNOSIS — Z1211 Encounter for screening for malignant neoplasm of colon: Secondary | ICD-10-CM

## 2022-09-01 NOTE — Telephone Encounter (Signed)
Pt came into office to ask if pcp/nurse would put in another referral to Dr. Kenna Gilbert office on [facility] for his colonoscopy. Pt stated he is still waiting to hear from the other office he was referred to for his colonoscopy and this is has now been 2 months. Please advise.  Cb#: [phone]

## 2022-09-14 NOTE — Telephone Encounter (Signed)
LMOVM to call back to schedule with Dr. Abbey Chatters

## 2022-09-14 NOTE — Telephone Encounter (Signed)
Ok to schedule. ASA 3.   

## 2022-09-15 NOTE — Telephone Encounter (Signed)
Dr. Marletta Lor is now full. Will call with future schedule

## 2022-09-24 ENCOUNTER — Ambulatory Visit (HOSPITAL_COMMUNITY)
Admission: RE | Admit: 2022-09-24 | Discharge: 2022-09-24 | Disposition: A | Discharge status: Home / Self Care | Payer: Medicare HMO | Source: Ambulatory Visit | Attending: Internal Medicine | Admitting: Internal Medicine

## 2022-09-24 DIAGNOSIS — R079 Chest pain, unspecified: Secondary | ICD-10-CM | POA: Diagnosis not present

## 2022-09-24 LAB — ECHOCARDIOGRAM COMPLETE
Area-P 1/2: 3.91 cm2
MV M vel: 4.26 m/s
MV Peak grad: 72.6 mmHg
Radius: 0.3 cm
S' Lateral: 3.1 cm

## 2022-09-24 NOTE — Progress Notes (Signed)
*  PRELIMINARY RESULTS* Echocardiogram 2D Echocardiogram has been performed.  Stacey Drain 09/24/2022, 11:27 AM

## 2022-09-25 ENCOUNTER — Telehealth: Payer: Self-pay

## 2022-09-25 MED ORDER — FUROSEMIDE 20 MG PO TABS: ORAL_TABLET | ORAL | 3 refills | Status: DC

## 2022-09-25 NOTE — Telephone Encounter (Signed)
Echo results discussed with patient, rx sent for lasix 20 mg prn  Messaged scheduling for f/u

## 2022-09-25 NOTE — Telephone Encounter (Signed)
-----   Message from Vishnu P Mallipeddi sent at 09/25/2022  8:55 AM EDT ----- Normal pumping function of the heart, G2 DD (some stiff heart), mild to moderate thickness of LV and RV, mild MR (leakiness across the mitral valve). Can prescribe p.o. Lasix 20 mg as needed for SOB/LE swelling (due to abnormal diastolic dysfunction although patient had DOE only with overexertion).  Schedule follow-up in 6 months.

## 2022-09-30 NOTE — Telephone Encounter (Signed)
Pt went to his PCP and asked to  another referral to Dr. Kenna Gilbert office on [facility] for his colonoscopy. Pt told his PCP he was waiting to hear from the other office he was referred to for his colonoscopy and hadn't heard anything.

## 2022-10-01 ENCOUNTER — Other Ambulatory Visit: Payer: Self-pay | Admitting: Family Medicine

## 2022-10-01 DIAGNOSIS — I10 Essential (primary) hypertension: Secondary | ICD-10-CM

## 2022-10-02 NOTE — Telephone Encounter (Signed)
Requested Prescriptions  Refused Prescriptions Disp Refills   losartan-hydrochlorothiazide (HYZAAR) 50-12.5 MG tablet [Pharmacy Med Name: LOSARTAN POTASSIUM/HYDROCHLOROTHIAZIDE 50-12.5 MG Tablet] 90 tablet 3    Sig: TAKE 1 TABLET EVERY MORNING     Cardiovascular: ARB + Diuretic Combos Failed - 10/01/2022 12:04 PM      Failed - Cr in normal range and within 180 days    Creat  Date Value Ref Range Status  07/09/2022 1.54 (H) 0.70 - 1.28 mg/dL Final         Failed - Valid encounter within last 6 months    Recent Outpatient Visits           1 year ago Essential hypertension   [facility] Pickard, Priscille Heidelberg, MD   1 year ago Bone pain   [facility] Donita Brooks, MD   1 year ago Abrasion of left cornea, subsequent encounter   [facility] Donita Brooks, MD   1 year ago Abrasion of left cornea, initial encounter   [facility] Donita Brooks, MD   2 years ago Cervical radiculopathy   [facility] Pickard, Priscille Heidelberg, MD       Future Appointments             In 6 months Mallipeddi, Orion Modest, MD [facility] [place], [place]            Passed - K in normal range and within 180 days    Potassium  Date Value Ref Range Status  07/09/2022 3.9 3.5 - 5.3 mmol/L Final         Passed - Na in normal range and within 180 days    Sodium  Date Value Ref Range Status  07/09/2022 141 135 - 146 mmol/L Final         Passed - eGFR is 10 or above and within 180 days    GFR, Est African American  Date Value Ref Range Status  05/17/2020 72 > OR = 60 mL/min/1.20m2 Final   GFR, Est Non African American  Date Value Ref Range Status  05/17/2020 62 > OR = 60 mL/min/1.65m2 Final   eGFR  Date Value Ref Range Status  07/09/2022 48 (L) > OR = 60 mL/min/1.78m2 Final         Passed - Patient is not pregnant      Passed - Last BP in normal range    BP Readings from Last 1 Encounters:   08/14/22 116/72

## 2022-10-29 DIAGNOSIS — M25511 Pain in right shoulder: Secondary | ICD-10-CM | POA: Diagnosis not present

## 2022-10-29 DIAGNOSIS — J069 Acute upper respiratory infection, unspecified: Secondary | ICD-10-CM | POA: Diagnosis not present

## 2022-10-29 DIAGNOSIS — G8929 Other chronic pain: Secondary | ICD-10-CM | POA: Diagnosis not present

## 2022-10-29 DIAGNOSIS — U071 COVID-19: Secondary | ICD-10-CM | POA: Diagnosis not present

## 2022-11-12 ENCOUNTER — Ambulatory Visit: Payer: Medicare HMO

## 2022-11-12 VITALS — BP 128/64 | Ht 68.0 in | Wt 182.3 lb

## 2022-11-12 DIAGNOSIS — Z0000 Encounter for general adult medical examination without abnormal findings: Secondary | ICD-10-CM | POA: Diagnosis not present

## 2022-11-12 NOTE — Progress Notes (Signed)
Subjective:   Cody [name] is a 72 y.o. male who presents for Medicare Annual/Subsequent preventive examination.  Visit Complete: In person  Cardiac Risk Factors include: advanced age (>76men, >39 women);male gender;hypertension     Objective:    Today's Vitals   11/12/22 1424  BP: 128/64  Weight: 182 lb 4.8 oz (82.7 kg)  Height: 5\' 8"  (1.727 m)   Body mass index is 27.72 kg/m.     11/12/2022    2:31 PM 05/26/2021    6:39 AM 10/04/2020    3:05 PM 05/17/2020    8:25 AM  Advanced Directives  Does Patient Have a Medical Advance Directive? No No No No  Would patient like information on creating a medical advance directive? Yes (MAU/Ambulatory/Procedural Areas - Information given) No - Patient declined Yes (MAU/Ambulatory/Procedural Areas - Information given) Yes (ED - Information included in AVS)    Current Medications (verified) Outpatient Encounter Medications as of 11/12/2022  Medication Sig   aspirin 81 MG EC tablet Take by mouth.   fluticasone (FLONASE) 50 MCG/ACT nasal spray USE 2 SPRAYS IN EACH NOSTRIL EVERY DAY   losartan-hydrochlorothiazide (HYZAAR) 50-12.5 MG tablet TAKE 1 TABLET EVERY DAY IN THE MORNING   pravastatin (PRAVACHOL) 10 MG tablet TAKE 1 TABLET EVERY DAY   furosemide (LASIX) 20 MG tablet Take daily as needed for shortness of breath, or leg swelling (Patient not taking: Reported on 11/12/2022)   Facility-Administered Encounter Medications as of 11/12/2022  Medication   Hyaluronan (MONOVISC) intra-articular injection 88 mg    Allergies (verified) Codeine and Statins   History: Past Medical History:  Diagnosis Date   Acute URI 11/08/2012   Allergy    Bradycardia    CVA (cerebral infarction)    Leukopenia    Migraine headache    Prediabetes    Statin intolerance 03/08/2018   Past Surgical History:  Procedure Laterality Date   SHOULDER SURGERY     right   Family History  Problem Relation Age of Onset   Hypertension Mother     Hyperlipidemia Mother    Diabetes Mother    Social History   Socioeconomic History   Marital status: Married    Spouse name: Not on file   Number of children: Not on file   Years of education: Not on file   Highest education level: Not on file  Occupational History   Not on file  Tobacco Use   Smoking status: Never   Smokeless tobacco: Never  Vaping Use   Vaping status: Never Used  Substance and Sexual Activity   Alcohol use: No   Drug use: No   Sexual activity: Yes    Birth control/protection: None  Other Topics Concern   Not on file  Social History Narrative   Not on file   Social Determinants of Health   Financial Resource Strain: Low Risk  (11/12/2022)   Overall Financial Resource Strain (CARDIA)    Difficulty of Paying Living Expenses: Not hard at all  Food Insecurity: No Food Insecurity (11/12/2022)   Hunger Vital Sign    Worried About Running Out of Food in the Last Year: Never true    Ran Out of Food in the Last Year: Never true  Transportation Needs: No Transportation Needs (11/12/2022)   PRAPARE - Administrator, Civil Service (Medical): No    Lack of Transportation (Non-Medical): No  Physical Activity: Sufficiently Active (11/12/2022)   Exercise Vital Sign    Days of Exercise per Week:  5 days    Minutes of Exercise per Session: 30 min  Stress: No Stress Concern Present (11/12/2022)   [employer] of Occupational Health - Occupational Stress Questionnaire    Feeling of Stress : Not at all  Social Connections: Socially Integrated (11/12/2022)   Social Connection and Isolation Panel [NHANES]    Frequency of Communication with Friends and Family: More than three times a week    Frequency of Social Gatherings with Friends and Family: Three times a week    Attends Religious Services: More than 4 times per year    Active Member of Clubs or Organizations: Yes    Attends Banker Meetings: 1 to 4 times per year    Marital Status: Married     Tobacco Counseling Counseling given: Not Answered   Clinical Intake:  Pre-visit preparation completed: Yes  Pain : No/denies pain     Diabetes: No  How often do you need to have someone help you when you read instructions, pamphlets, or other written materials from your doctor or pharmacy?: 1 - Never  Interpreter Needed?: No  Information entered by :: Kandis Fantasia LPN   Activities of Daily Living    11/12/2022    2:30 PM  In your present state of health, do you have any difficulty performing the following activities:  Hearing? 0  Vision? 0  Difficulty concentrating or making decisions? 0  Walking or climbing stairs? 0  Dressing or bathing? 0  Doing errands, shopping? 0  Preparing Food and eating ? N  Using the Toilet? N  In the past six months, have you accidently leaked urine? N  Do you have problems with loss of bowel control? N  Managing your Medications? N  Managing your Finances? N  Housekeeping or managing your Housekeeping? N    Patient Care Team: Donita Brooks, MD as PCP - General (Family Medicine) Marjo Bicker, MD as PCP - Cardiology (Cardiology) Huel Cote, MD as Consulting Physician (Orthopedic Surgery)  Indicate any recent Medical Services you may have received from other than [facility] providers in the past year (date may be approximate).     Assessment:   This is a routine wellness examination for Cody [name].  Hearing/Vision screen Hearing Screening - Comments:: Denies hearing difficulties   Vision Screening - Comments:: No vision problems; will schedule routine eye exam soon     Goals Addressed             This Visit's Progress    Remain active and independent       COMPLETED: Track and Manage My Blood Pressure-Hypertension       Timeframe:  Long-Range Goal Priority:  Medium Start Date:            10/04/2020                 Expected End Date:       04/06/2021                Follow Up Date  11/29/2020   - check  blood pressure 3 times per week - choose a place to take my blood pressure (home, clinic or office, retail store) - write blood pressure results in a log or diary  - please follow a low sodium diet - education sent via My Chart- low sodium - take all medications as prescribed - continue to exercise daily   Why is this important?   You won't feel high blood pressure, but it can still  hurt your blood vessels.  High blood pressure can cause heart or kidney problems. It can also cause a stroke.  Making lifestyle changes like losing a little weight or eating less salt will help.  Checking your blood pressure at home and at different times of the day can help to control blood pressure.  If the doctor prescribes medicine remember to take it the way the doctor ordered.  Call the office if you cannot afford the medicine or if there are questions about it.     Notes:       Depression Screen    11/12/2022    2:52 PM 07/09/2022    2:48 PM 06/30/2022    2:36 PM 01/20/2022    3:23 PM 12/23/2021    3:36 PM 11/26/2020    2:22 PM 10/04/2020    3:04 PM  PHQ 2/9 Scores  PHQ - 2 Score 0 0 0 0 0 0 0  PHQ- 9 Score  0 2        Fall Risk    11/12/2022    2:32 PM 07/09/2022    2:48 PM 06/30/2022    2:35 PM 01/20/2022    3:23 PM 12/23/2021    3:36 PM  Fall Risk   Falls in the past year? 0 0 0 0 0  Number falls in past yr: 0 0 0 0 0  Injury with Fall? 0 0 0 0 0  Risk for fall due to : No Fall Risks No Fall Risks No Fall Risks No Fall Risks No Fall Risks  Follow up Falls prevention discussed;Education provided;Falls evaluation completed Falls prevention discussed Falls evaluation completed Falls prevention discussed Falls prevention discussed    MEDICARE RISK AT HOME: Medicare Risk at Home Any stairs in or around the home?: No If so, are there any without handrails?: No Home free of loose throw rugs in walkways, pet beds, electrical cords, etc?: Yes Adequate lighting in your home to reduce risk of  falls?: Yes Life alert?: No Use of a cane, walker or w/c?: No Grab bars in the bathroom?: Yes Shower chair or bench in shower?: No Elevated toilet seat or a handicapped toilet?: No  TIMED UP AND GO:  Was the test performed?  Yes  Length of time to ambulate 10 feet: 7 sec Gait steady and fast without use of assistive device    Cognitive Function:        11/12/2022    2:53 PM 05/17/2020    8:25 AM  6CIT Screen  What Year? 0 points 0 points  What month? 0 points 0 points  What time? 0 points 0 points  Count back from 20 0 points 0 points  Months in reverse 0 points 0 points  Repeat phrase 0 points 0 points  Total Score 0 points 0 points    Immunizations Immunization History  Administered Date(s) Administered   Fluad Quad(high Dose 65+) 10/26/2018, 11/05/2021   Influenza, High Dose Seasonal PF 11/11/2017, 12/08/2019, 11/06/2020   Influenza,inj,Quad PF,6+ Mos 11/29/2016   Influenza-Unspecified 11/16/2015, 10/26/2018, 11/03/2022   PFIZER(Purple Top)SARS-COV-2 Vaccination 04/19/2019, 05/10/2019, 12/08/2019, 06/10/2020   Pfizer Covid-19 Vaccine Bivalent Booster 33yrs & up 11/06/2020   Pfizer(Comirnaty)Fall Seasonal Vaccine 12 years and older 11/03/2022   Pneumococcal Conjugate-13 02/25/2018   Pneumococcal Polysaccharide-23 03/06/2019   Td 05/18/2006   Tdap 06/07/2006, 06/12/2016   Zoster Recombinant(Shingrix) 06/04/2020, 08/15/2020   Zoster, Live 07/29/2012    TDAP status: Up to date  Flu Vaccine status: Up to date  Pneumococcal  vaccine status: Up to date  Covid-19 vaccine status: Information provided on how to obtain vaccines.   Qualifies for Shingles Vaccine? Yes   Zostavax completed No   Shingrix Completed?: Yes  Screening Tests Health Maintenance  Topic Date Due   Colonoscopy  12/24/2022   COVID-19 Vaccine (7 - 2023-24 season) 03/05/2023   Medicare Annual Wellness (AWV)  11/12/2023   DTaP/Tdap/Td (4 - Td or Tdap) 06/13/2026   Pneumonia Vaccine 14+ Years old   Completed   INFLUENZA VACCINE  Completed   Hepatitis C Screening  Completed   Zoster Vaccines- Shingrix  Completed   HPV VACCINES  Aged Out    Health Maintenance  Health Maintenance Due  Topic Date Due   Colonoscopy  12/24/2022    Colorectal cancer screening: Type of screening: Colonoscopy. Completed 12/23/12. Repeat every 10 years  Lung Cancer Screening: (Low Dose CT Chest recommended if Age 61-80 years, 20 pack-year currently smoking OR have quit w/in 15years.) does not qualify.   Lung Cancer Screening Referral: n/a  Additional Screening:  Hepatitis C Screening: does qualify; Completed 06/18/16  Vision Screening: Recommended annual ophthalmology exams for early detection of glaucoma and other disorders of the eye. Is the patient up to date with their annual eye exam?  No  Who is the provider or what is the name of the office in which the patient attends annual eye exams? none If pt is not established with a provider, would they like to be referred to a provider to establish care? Yes .   Dental Screening: Recommended annual dental exams for proper oral hygiene  Community Resource Referral / Chronic Care Management: CRR required this visit?  No   CCM required this visit?  No     Plan:     I have personally reviewed and noted the following in the patient's chart:   Medical and social history Use of alcohol, tobacco or illicit drugs  Current medications and supplements including opioid prescriptions. Patient is not currently taking opioid prescriptions. Functional ability and status Nutritional status Physical activity Advanced directives List of other physicians Hospitalizations, surgeries, and ER visits in previous 12 months Vitals Screenings to include cognitive, depression, and falls Referrals and appointments  In addition, I have reviewed and discussed with patient certain preventive protocols, quality metrics, and best practice recommendations. A written  personalized care plan for preventive services as well as general preventive health recommendations were provided to patient.     Kandis Fantasia [place], [place]   10/15/5619   After Visit Summary: (In Person-Printed) AVS printed and given to the patient  Nurse Notes: Patient is complaining of right knee and shoulder pain.  And also is having problems with a lingering cough.

## 2022-11-12 NOTE — Patient Instructions (Signed)
Cody [name] , Thank you for taking time to come for your Medicare Wellness Visit. I appreciate your ongoing commitment to your health goals. Please review the following plan we discussed and let me know if I can assist you in the future.   Referrals/Orders/Follow-Ups/Clinician Recommendations: Aim for 30 minutes of exercise or brisk walking, 6-8 glasses of water, and 5 servings of fruits and vegetables each day.  This is a list of the screening recommended for you and due dates:  Health Maintenance  Topic Date Due   Colon Cancer Screening  12/24/2022   COVID-19 Vaccine (7 - 2023-24 season) 03/05/2023   Medicare Annual Wellness Visit  07/09/2023   DTaP/Tdap/Td vaccine (4 - Td or Tdap) 06/13/2026   Pneumonia Vaccine  Completed   Flu Shot  Completed   Hepatitis C Screening  Completed   Zoster (Shingles) Vaccine  Completed   HPV Vaccine  Aged Out    Advanced directives: (ACP Link)Information on Advanced Care Planning can be found at [facility] Advance Health Care Directives Advance Health Care Directives ([email or web address]   Next Medicare Annual Wellness Visit scheduled for next year: Yes

## 2022-12-09 ENCOUNTER — Ambulatory Visit (HOSPITAL_BASED_OUTPATIENT_CLINIC_OR_DEPARTMENT_OTHER): Payer: Medicare HMO | Admitting: Orthopaedic Surgery

## 2022-12-09 DIAGNOSIS — M1711 Unilateral primary osteoarthritis, right knee: Secondary | ICD-10-CM

## 2022-12-09 DIAGNOSIS — M19011 Primary osteoarthritis, right shoulder: Secondary | ICD-10-CM | POA: Diagnosis not present

## 2022-12-09 NOTE — Progress Notes (Signed)
Chief Complaint: Right knee pain     History of Present Illness:   12/09/2022: Follow-up for right shoulder.  At this time he has now had an injection without significant relief.  At this time he has very limited overhead range of motion.  Cody [name] is a 72 y.o. male presents today with right knee pain which has been ongoing now for the last several months.  He states this is on and off but worse with walking.  He does currently work delivering medications to pharmacist.  States that the pain is predominantly medial.  Denies any mechanical symptoms.  Denies any specific injury to the knee.    Surgical History:   none  PMH/PSH/Family History/Social History/Meds/Allergies:    Past Medical History:  Diagnosis Date   Acute URI 11/08/2012   Allergy    Bradycardia    CVA (cerebral infarction)    Leukopenia    Migraine headache    Prediabetes    Statin intolerance 03/08/2018   Past Surgical History:  Procedure Laterality Date   SHOULDER SURGERY     right   Social History   Socioeconomic History   Marital status: Married    Spouse name: Not on file   Number of children: Not on file   Years of education: Not on file   Highest education level: Not on file  Occupational History   Not on file  Tobacco Use   Smoking status: Never   Smokeless tobacco: Never  Vaping Use   Vaping status: Never Used  Substance and Sexual Activity   Alcohol use: No   Drug use: No   Sexual activity: Yes    Birth control/protection: None  Other Topics Concern   Not on file  Social History Narrative   Not on file   Social Determinants of Health   Financial Resource Strain: Low Risk  (11/12/2022)   Overall Financial Resource Strain (CARDIA)    Difficulty of Paying Living Expenses: Not hard at all  Food Insecurity: No Food Insecurity (11/12/2022)   Hunger Vital Sign    Worried About Running Out of Food in the Last Year: Never true    Ran Out of  Food in the Last Year: Never true  Transportation Needs: No Transportation Needs (11/12/2022)   PRAPARE - Administrator, Civil Service (Medical): No    Lack of Transportation (Non-Medical): No  Physical Activity: Sufficiently Active (11/12/2022)   Exercise Vital Sign    Days of Exercise per Week: 5 days    Minutes of Exercise per Session: 30 min  Stress: No Stress Concern Present (11/12/2022)   [employer] of Occupational Health - Occupational Stress Questionnaire    Feeling of Stress : Not at all  Social Connections: Socially Integrated (11/12/2022)   Social Connection and Isolation Panel [NHANES]    Frequency of Communication with Friends and Family: More than three times a week    Frequency of Social Gatherings with Friends and Family: Three times a week    Attends Religious Services: More than 4 times per year    Active Member of Clubs or Organizations: Yes    Attends Banker Meetings: 1 to 4 times per year    Marital Status: Married   Family History  Problem Relation Age of Onset   Hypertension  Mother    Hyperlipidemia Mother    Diabetes Mother    Allergies  Allergen Reactions   Codeine     Generalized itching    Statins    Current Outpatient Medications  Medication Sig Dispense Refill   aspirin 81 MG EC tablet Take by mouth.     fluticasone (FLONASE) 50 MCG/ACT nasal spray USE 2 SPRAYS IN EACH NOSTRIL EVERY DAY 48 g 3   furosemide (LASIX) 20 MG tablet Take daily as needed for shortness of breath, or leg swelling (Patient not taking: Reported on 11/12/2022) 30 tablet 3   losartan-hydrochlorothiazide (HYZAAR) 50-12.5 MG tablet TAKE 1 TABLET EVERY DAY IN THE MORNING 90 tablet 3   pravastatin (PRAVACHOL) 10 MG tablet TAKE 1 TABLET EVERY DAY 90 tablet 3   Current Facility-Administered Medications  Medication Dose Route Frequency Provider Last Rate Last Admin   Hyaluronan (MONOVISC) intra-articular injection 88 mg  88 mg Intra-articular Once  Overturf, Jackson L, PA-C       No results found.  Review of Systems:   A ROS was performed including pertinent positives and negatives as documented in the HPI.  Physical Exam :   Constitutional: NAD and appears stated age Neurological: Alert and oriented Psych: Appropriate affect and cooperative There were no vitals taken for this visit.   Comprehensive Musculoskeletal Exam:    Right knee tenderness to palpation about the medial tibiofemoral joint.  There is no effusion.  Range of motion is from 0 to 130 degrees.  Negative Lachman  Right shoulder with tenderness palpation about the lateral deltoid as well as the glenohumeral joint.  Overhead range of motion is to 170 degrees equal to the contralateral side with external rotation at side to 50 degrees internal rotation is to L1.   Imaging:   Xray (4 views right knee, 3 views right shoulder): Medial joint space narrowing consistent with very mild osteoarthritis.  Right shoulder with significant moderate to severe osteoarthritis of the glenohumeral joint   I personally reviewed and interpreted the radiographs.   Assessment:   72 y.o. male with right knee pain consistent with right shoulder osteoarthritis.  At this point he did get some relief from his first injection although this is subsequently worn off.  Given this we did discuss additional treatment options.  He has been working on strengthening the shoulder without any relief.  He is having a hard time laying directly on the side or performing overhead activities.  We did discuss surgical options as well given his failure of his previous injections and strengthening.  Specifically I do believe he would be a candidate for reverse shoulder arthroplasty.  I did discuss all the risks and limitations associated with this.  After discussion he is ultimately elected for this.  Plan :    -Plan for right shoulder reverse shoulder arthroplasty    After a lengthy discussion of treatment  options, including risks, benefits, alternatives, complications of surgical and nonsurgical conservative options, the patient elected surgical repair.   The patient  is aware of the material risks  and complications including, but not limited to injury to adjacent structures, neurovascular injury, infection, numbness, bleeding, implant failure, thermal burns, stiffness, persistent pain, failure to heal, disease transmission from allograft, need for further surgery, dislocation, anesthetic risks, blood clots, risks of death,and others. The probabilities of surgical success and failure discussed with patient given their particular co-morbidities.The time and nature of expected rehabilitation and recovery was discussed.The patient's questions were all answered preoperatively.  No  barriers to understanding were noted. I explained the natural history of the disease process and Rx rationale.  I explained to the patient what I considered to be reasonable expectations given their personal situation.  The final treatment plan was arrived at through a shared patient decision making process model.      I personally saw and evaluated the patient, and participated in the management and treatment plan.  Huel Cote, MD Attending Physician, Orthopedic Surgery  This document was dictated using Dragon voice recognition software. A reasonable attempt at proof reading has been made to minimize errors.

## 2022-12-11 ENCOUNTER — Encounter (HOSPITAL_BASED_OUTPATIENT_CLINIC_OR_DEPARTMENT_OTHER): Payer: Self-pay | Admitting: Orthopaedic Surgery

## 2022-12-12 ENCOUNTER — Other Ambulatory Visit: Payer: Self-pay | Admitting: Family Medicine

## 2022-12-14 NOTE — Telephone Encounter (Signed)
Requested Prescriptions  Pending Prescriptions Disp Refills   pravastatin (PRAVACHOL) 10 MG tablet [Pharmacy Med Name: Pravastatin Sodium Oral Tablet 10 MG] 90 tablet 1    Sig: TAKE 1 TABLET EVERY DAY     Cardiovascular:  Antilipid - Statins Failed - 12/12/2022  2:23 AM      Failed - Valid encounter within last 12 months    Recent Outpatient Visits           1 year ago Essential hypertension   [facility] Pickard, Priscille Heidelberg, MD   1 year ago Bone pain   [facility] Tanya Nones, Priscille Heidelberg, MD   2 years ago Abrasion of left cornea, subsequent encounter   [facility] Tanya Nones Priscille Heidelberg, MD   2 years ago Abrasion of left cornea, initial encounter   [facility] Pickard, Priscille Heidelberg, MD   2 years ago Cervical radiculopathy   [facility] Donita Brooks, MD       Future Appointments             In 3 months Mallipeddi, Orion Modest, MD [facility] [place], [place]            Failed - Lipid Panel in normal range within the last 12 months    Cholesterol  Date Value Ref Range Status  07/09/2022 166 <200 mg/dL Final   LDL Cholesterol (Calc)  Date Value Ref Range Status  07/09/2022 66 mg/dL (calc) Final    Comment:    Reference range: <100 . Desirable range <100 mg/dL for primary prevention;   <70 mg/dL for patients with CHD or diabetic patients  with > or = 2 CHD risk factors. Marland Kitchen LDL-C is now calculated using the Martin-Hopkins  calculation, which is a validated novel method providing  better accuracy than the Friedewald equation in the  estimation of LDL-C.  Horald Pollen et al. Lenox Ahr. 0737;106(26): 2061-2068  (http://education.[facility].com/faq/FAQ164)    HDL  Date Value Ref Range Status  07/09/2022 86 > OR = 40 mg/dL Final   Triglycerides  Date Value Ref Range Status  07/09/2022 48 <150 mg/dL Final         Passed - Patient is not pregnant

## 2022-12-15 ENCOUNTER — Ambulatory Visit
Admission: RE | Admit: 2022-12-15 | Discharge: 2022-12-15 | Disposition: A | Discharge status: Home / Self Care | Payer: Medicare HMO | Source: Ambulatory Visit | Attending: Orthopaedic Surgery | Admitting: Orthopaedic Surgery

## 2022-12-15 DIAGNOSIS — M19011 Primary osteoarthritis, right shoulder: Secondary | ICD-10-CM | POA: Diagnosis not present

## 2022-12-15 DIAGNOSIS — M25411 Effusion, right shoulder: Secondary | ICD-10-CM | POA: Diagnosis not present

## 2022-12-21 ENCOUNTER — Other Ambulatory Visit: Payer: Self-pay | Admitting: Internal Medicine

## 2023-02-24 ENCOUNTER — Encounter: Payer: Self-pay | Admitting: Family Medicine

## 2023-02-24 ENCOUNTER — Ambulatory Visit (INDEPENDENT_AMBULATORY_CARE_PROVIDER_SITE_OTHER): Payer: Medicare HMO | Admitting: Family Medicine

## 2023-02-24 VITALS — BP 120/90 | HR 84 | Temp 99.0°F | Ht 68.0 in | Wt 181.0 lb

## 2023-02-24 DIAGNOSIS — J069 Acute upper respiratory infection, unspecified: Secondary | ICD-10-CM

## 2023-02-24 MED ORDER — ALBUTEROL SULFATE HFA 108 (90 BASE) MCG/ACT IN AERS: 2.0000 | INHALATION_SPRAY | Freq: Four times a day (QID) | RESPIRATORY_TRACT | 0 refills | Status: AC | PRN

## 2023-02-24 NOTE — Progress Notes (Addendum)
 Subjective:  HPI: Cody SCHERTZER is a 73 y.o. male presenting on 02/24/2023 for Acute Visit (cough, cold and mild fever for the last few days, OTC not working)   HPI Patient is in today for mucopurulent cough, congestion, rhinorrhea, and chills for 5 days. Mild SOB and wheezing Denies pleurisy, N/V/D, sinus pain, loss of taste or smell, dental pain Exposed to his grandson who had RSV, declines viral testing today. Has tried Alka-Seltzer, Delsym  Review of Systems  All other systems reviewed and are negative.   Relevant past medical history reviewed and updated as indicated.   Past Medical History:  Diagnosis Date   Acute URI 11/08/2012   Allergy    Bradycardia    CVA (cerebral infarction)    Leukopenia    Migraine headache    Prediabetes    Statin intolerance 03/08/2018     Past Surgical History:  Procedure Laterality Date   SHOULDER SURGERY     right    Allergies and medications reviewed and updated.   Current Outpatient Medications:    albuterol  (VENTOLIN  HFA) 108 (90 Base) MCG/ACT inhaler, Inhale 2 puffs into the lungs every 6 (six) hours as needed for wheezing or shortness of breath., Disp: 8 g, Rfl: 0   aspirin 81 MG EC tablet, Take by mouth., Disp: , Rfl:    fluticasone  (FLONASE ) 50 MCG/ACT nasal spray, USE 2 SPRAYS IN EACH NOSTRIL EVERY DAY, Disp: 48 g, Rfl: 3   furosemide  (LASIX ) 20 MG tablet, TAKE DAILY AS NEEDED FOR SHORTNESS OF BREATH, OR LEG SWELLING, Disp: 90 tablet, Rfl: 1   losartan -hydrochlorothiazide (HYZAAR) 50-12.5 MG tablet, TAKE 1 TABLET EVERY DAY IN THE MORNING, Disp: 90 tablet, Rfl: 3   pravastatin  (PRAVACHOL ) 10 MG tablet, TAKE 1 TABLET EVERY DAY, Disp: 90 tablet, Rfl: 1  Current Facility-Administered Medications:    Hyaluronan (MONOVISC) intra-articular injection 88 mg, 88 mg, Intra-articular, Once, Overturf, Jackson L, PA-C  Allergies  Allergen Reactions   Codeine     Generalized itching    Statins     Objective:   BP (!)  120/90   Pulse 84   Temp 99 F (37.2 C) (Oral)   Ht 5' 8 (1.727 m)   Wt 181 lb (82.1 kg)   SpO2 96%   BMI 27.52 kg/m      02/24/2023    3:40 PM 02/24/2023    3:37 PM 11/12/2022    2:24 PM  Vitals with BMI  Height  5' 8 5' 8  Weight  181 lbs 182 lbs 5 oz  BMI  27.53 27.73  Systolic 120 124 871  Diastolic 90 92 64  Pulse  84      Physical Exam Vitals and nursing note reviewed.  Constitutional:      Appearance: Normal appearance. He is normal weight.  HENT:     Head: Normocephalic and atraumatic.     Right Ear: Tympanic membrane, ear canal and external ear normal.     Left Ear: Tympanic membrane, ear canal and external ear normal.     Nose: Congestion present.     Right Sinus: No maxillary sinus tenderness or frontal sinus tenderness.     Left Sinus: No maxillary sinus tenderness or frontal sinus tenderness.     Mouth/Throat:     Mouth: Mucous membranes are moist.     Tongue: Lesions present.     Pharynx: Oropharynx is clear.     Comments: Presence of scattered red patches consistent with geographic tongue Eyes:  Conjunctiva/sclera: Conjunctivae normal.  Cardiovascular:     Rate and Rhythm: Normal rate and regular rhythm.     Pulses: Normal pulses.     Heart sounds: Normal heart sounds.  Pulmonary:     Effort: Pulmonary effort is normal.     Breath sounds: Normal breath sounds.  Musculoskeletal:     Cervical back: No tenderness.  Lymphadenopathy:     Cervical: No cervical adenopathy.  Skin:    General: Skin is warm and dry.     Capillary Refill: Capillary refill takes less than 2 seconds.  Neurological:     General: No focal deficit present.     Mental Status: He is alert and oriented to person, place, and time. Mental status is at baseline.  Psychiatric:        Mood and Affect: Mood normal.        Behavior: Behavior normal.        Thought Content: Thought content normal.        Judgment: Judgment normal.     Assessment & Plan:  Viral URI with  cough Assessment & Plan: Reassured patient that symptoms and exam findings are most consistent with a viral upper respiratory infection and explained lack of efficacy of antibiotics against viruses.  Discussed expected course and features suggestive of secondary bacterial infection.  Continue supportive care. Increase fluid intake with water or electrolyte solution like pedialyte. Encouraged acetaminophen  as needed for fever/pain. Encouraged salt water gargling, chloraseptic spray and throat lozenges. Encouraged OTC guaifenesin. Encouraged saline sinus flushes and/or neti with humidified air. Presence of lesions to his tongue consistent with geographic tongue, he reports these are chronic at baseline and unchanged. Return to office if symptoms worsen or persist.   Other orders -     Albuterol  Sulfate HFA; Inhale 2 puffs into the lungs every 6 (six) hours as needed for wheezing or shortness of breath.  Dispense: 8 g; Refill: 0     Follow up plan: Return if symptoms worsen or fail to improve.  Jeoffrey GORMAN Barrio, FNP

## 2023-02-24 NOTE — Assessment & Plan Note (Addendum)
 Reassured patient that symptoms and exam findings are most consistent with a viral upper respiratory infection and explained lack of efficacy of antibiotics against viruses.  Discussed expected course and features suggestive of secondary bacterial infection.  Continue supportive care. Increase fluid intake with water or electrolyte solution like pedialyte. Encouraged acetaminophen  as needed for fever/pain. Encouraged salt water gargling, chloraseptic spray and throat lozenges. Encouraged OTC guaifenesin. Encouraged saline sinus flushes and/or neti with humidified air. Presence of lesions to his tongue consistent with geographic tongue, he reports these are chronic at baseline and unchanged. Return to office if symptoms worsen or persist.

## 2023-02-24 NOTE — Patient Instructions (Signed)
 Your symptoms and exam findings are most consistent with a viral upper respiratory infection. These usually run their course in 5-7 days. Unfortunately, antibiotics don't work against viruses and just increase your risk of other issues such as diarrhea, yeast infections, and resistant infections.  If your symptoms last longer than 10 days and/or you start feeling worse with facial pain, high fever, cough, shortness of breath or start feeling significantly worse, please call us right away to be further evaluated.  Some things that can make you feel better are: - Increased rest - Increasing fluid with water/sugar free electrolytes - Acetaminophen as needed for fever/pain.  - Salt water gargling, chloraseptic spray and throat lozenges - OTC guaifenesin (Mucinex).  - Saline sinus flushes or a neti pot.  - Humidifying the air.

## 2023-02-25 DIAGNOSIS — D649 Anemia, unspecified: Secondary | ICD-10-CM | POA: Diagnosis not present

## 2023-02-25 DIAGNOSIS — E782 Mixed hyperlipidemia: Secondary | ICD-10-CM | POA: Diagnosis not present

## 2023-02-25 DIAGNOSIS — Z1211 Encounter for screening for malignant neoplasm of colon: Secondary | ICD-10-CM | POA: Diagnosis not present

## 2023-02-25 DIAGNOSIS — I10 Essential (primary) hypertension: Secondary | ICD-10-CM | POA: Diagnosis not present

## 2023-03-05 DIAGNOSIS — K2950 Unspecified chronic gastritis without bleeding: Secondary | ICD-10-CM | POA: Diagnosis not present

## 2023-03-05 DIAGNOSIS — K2970 Gastritis, unspecified, without bleeding: Secondary | ICD-10-CM | POA: Diagnosis not present

## 2023-03-05 DIAGNOSIS — D509 Iron deficiency anemia, unspecified: Secondary | ICD-10-CM | POA: Diagnosis not present

## 2023-03-05 DIAGNOSIS — Z1211 Encounter for screening for malignant neoplasm of colon: Secondary | ICD-10-CM | POA: Diagnosis not present

## 2023-03-05 LAB — HM COLONOSCOPY

## 2023-04-05 ENCOUNTER — Ambulatory Visit: Payer: Medicare HMO | Attending: Internal Medicine | Admitting: Internal Medicine

## 2023-04-05 NOTE — Progress Notes (Signed)
 Erroneous encounter - please disregard.

## 2023-04-12 ENCOUNTER — Ambulatory Visit (INDEPENDENT_AMBULATORY_CARE_PROVIDER_SITE_OTHER): Payer: Medicare HMO | Admitting: Family Medicine

## 2023-04-12 ENCOUNTER — Encounter: Payer: Self-pay | Admitting: Family Medicine

## 2023-04-12 VITALS — BP 114/62 | HR 72 | Temp 97.7°F | Ht 68.0 in | Wt 177.6 lb

## 2023-04-12 DIAGNOSIS — R051 Acute cough: Secondary | ICD-10-CM | POA: Diagnosis not present

## 2023-04-12 MED ORDER — HYDROCODONE BIT-HOMATROP MBR 5-1.5 MG/5ML PO SOLN: 5.0000 mL | Freq: Three times a day (TID) | ORAL | 0 refills | Status: AC | PRN

## 2023-04-12 NOTE — Progress Notes (Signed)
 Subjective:    Patient ID: Cody [name], male    DOB: 08/30/1950, 73 y.o.   MRN: [id number]  Cough   Patient states that he had an upper respiratory infection a few weeks ago with head congestion, rhinorrhea, postnasal drip.  That part has improved.  However he has had a lingering cough now for 3 weeks.  He denies any pleurisy.  He denies any hemoptysis.  He denies any chest pain.  He denies any sputum or purulent sputum.  Past Medical History:  Diagnosis Date  . Acute URI 11/08/2012  . Allergy   . Bradycardia   . CVA (cerebral infarction)   . Leukopenia   . Migraine headache   . Prediabetes   . Statin intolerance 03/08/2018   Past Surgical History:  Procedure Laterality Date  . SHOULDER SURGERY     right   Current Outpatient Medications on File Prior to Visit  Medication Sig Dispense Refill  . albuterol (VENTOLIN HFA) 108 (90 Base) MCG/ACT inhaler Inhale 2 puffs into the lungs every 6 (six) hours as needed for wheezing or shortness of breath. 8 g 0  . aspirin 81 MG EC tablet Take by mouth.    . fluticasone (FLONASE) 50 MCG/ACT nasal spray USE 2 SPRAYS IN EACH NOSTRIL EVERY DAY 48 g 3  . losartan-hydrochlorothiazide (HYZAAR) 50-12.5 MG tablet TAKE 1 TABLET EVERY DAY IN THE MORNING 90 tablet 3  . pravastatin (PRAVACHOL) 10 MG tablet TAKE 1 TABLET EVERY DAY 90 tablet 1   Current Facility-Administered Medications on File Prior to Visit  Medication Dose Route Frequency Provider Last Rate Last Admin  . Hyaluronan (MONOVISC) intra-articular injection 88 mg  88 mg Intra-articular Once Hazle Nordmann L, PA-C       Allergies  Allergen Reactions  . Codeine     Generalized itching   . Statins    Social History   Socioeconomic History  . Marital status: Married    Spouse name: Not on file  . Number of children: Not on file  . Years of education: Not on file  . Highest education level: Not on file  Occupational History  . Not on file  Tobacco Use  . Smoking  status: Never  . Smokeless tobacco: Never  Vaping Use  . Vaping status: Never Used  Substance and Sexual Activity  . Alcohol use: No  . Drug use: No  . Sexual activity: Yes    Birth control/protection: None  Other Topics Concern  . Not on file  Social History Narrative  . Not on file   Social Drivers of Health   Financial Resource Strain: Low Risk  (11/12/2022)   Overall Financial Resource Strain (CARDIA)   . Difficulty of Paying Living Expenses: Not hard at all  Food Insecurity: No Food Insecurity (11/12/2022)   Hunger Vital Sign   . Worried About Programme researcher, broadcasting/film/video in the Last Year: Never true   . Ran Out of Food in the Last Year: Never true  Transportation Needs: No Transportation Needs (11/12/2022)   PRAPARE - Transportation   . Lack of Transportation (Medical): No   . Lack of Transportation (Non-Medical): No  Physical Activity: Sufficiently Active (11/12/2022)   Exercise Vital Sign   . Days of Exercise per Week: 5 days   . Minutes of Exercise per Session: 30 min  Stress: No Stress Concern Present (11/12/2022)   [employer] of Occupational Health - Occupational Stress Questionnaire   . Feeling of Stress : Not at all  Social Connections: Socially Integrated (11/12/2022)   Social Connection and Isolation Panel [NHANES]   . Frequency of Communication with Friends and Family: More than three times a week   . Frequency of Social Gatherings with Friends and Family: Three times a week   . Attends Religious Services: More than 4 times per year   . Active Member of Clubs or Organizations: Yes   . Attends Banker Meetings: 1 to 4 times per year   . Marital Status: Married  Catering manager Violence: Not At Risk (11/12/2022)   Humiliation, Afraid, Rape, and Kick questionnaire   . Fear of Current or Ex-Partner: No   . Emotionally Abused: No   . Physically Abused: No   . Sexually Abused: No   Family History  Problem Relation Age of Onset  . Hypertension Mother    . Hyperlipidemia Mother   . Diabetes Mother       Review of Systems  Respiratory:  Positive for cough.   All other systems reviewed and are negative.      Objective:   Physical Exam Vitals reviewed.  Constitutional:      General: He is not in acute distress.    Appearance: He is well-developed. He is not diaphoretic.  HENT:     Head: Normocephalic and atraumatic.     Right Ear: Tympanic membrane, ear canal and external ear normal.     Left Ear: Tympanic membrane, ear canal and external ear normal.     Nose: Nose normal. No congestion or rhinorrhea.     Mouth/Throat:     Pharynx: No oropharyngeal exudate.  Eyes:     General: No scleral icterus.       Right eye: No discharge.        Left eye: No discharge.     Conjunctiva/sclera: Conjunctivae normal.     Pupils: Pupils are equal, round, and reactive to light.  Neck:     Thyroid: No thyromegaly.     Vascular: No JVD.     Trachea: No tracheal deviation.  Cardiovascular:     Rate and Rhythm: Normal rate and regular rhythm.     Heart sounds: Murmur heard.     No friction rub. No gallop.  Pulmonary:     Effort: Pulmonary effort is normal. No respiratory distress.     Breath sounds: Normal breath sounds. No stridor. No wheezing or rales.  Chest:     Chest wall: No tenderness.  Abdominal:     General: Bowel sounds are normal. There is no distension.     Palpations: Abdomen is soft. There is no mass.     Tenderness: There is no abdominal tenderness. There is no guarding or rebound.  Musculoskeletal:     Cervical back: Normal range of motion and neck supple.  Lymphadenopathy:     Cervical: No cervical adenopathy.  Skin:    General: Skin is warm.     Coloration: Skin is not pale.     Findings: No erythema or rash.  Neurological:     Mental Status: He is alert.     Motor: No abnormal muscle tone.     Deep Tendon Reflexes: Reflexes are normal and symmetric.  Psychiatric:        Thought Content: Thought content normal.         Judgment: Judgment normal.         Assessment & Plan:  Acute cough Patient appears completely healthy today.  I believe that this is more  of a nagging postnasal drip cough.  He can use Hycodan 1 teaspoon every 8 hours as needed for cough.  Anticipate spontaneous resolution in the next week

## 2023-04-15 ENCOUNTER — Other Ambulatory Visit: Payer: Self-pay | Admitting: Family Medicine

## 2023-04-15 ENCOUNTER — Telehealth: Payer: Self-pay

## 2023-04-15 MED ORDER — BENZONATATE 200 MG PO CAPS: 200.0000 mg | ORAL_CAPSULE | Freq: Three times a day (TID) | ORAL | 0 refills | Status: AC | PRN

## 2023-04-15 NOTE — Telephone Encounter (Signed)
 Fax received from [facility]. They Hydrocodone-Homatropine Soln is on back order and they are requesting a different medication. Thank you.

## 2023-04-22 DIAGNOSIS — I10 Essential (primary) hypertension: Secondary | ICD-10-CM | POA: Diagnosis not present

## 2023-04-22 DIAGNOSIS — E782 Mixed hyperlipidemia: Secondary | ICD-10-CM | POA: Diagnosis not present

## 2023-04-22 DIAGNOSIS — K2970 Gastritis, unspecified, without bleeding: Secondary | ICD-10-CM | POA: Diagnosis not present

## 2023-04-22 DIAGNOSIS — D509 Iron deficiency anemia, unspecified: Secondary | ICD-10-CM | POA: Diagnosis not present

## 2023-05-27 ENCOUNTER — Encounter: Payer: Self-pay | Admitting: Family Medicine

## 2023-05-27 ENCOUNTER — Ambulatory Visit: Admitting: Family Medicine

## 2023-05-27 VITALS — BP 122/78 | HR 76 | Temp 98.6°F | Ht 68.0 in | Wt 180.8 lb

## 2023-05-27 DIAGNOSIS — R051 Acute cough: Secondary | ICD-10-CM | POA: Diagnosis not present

## 2023-05-27 MED ORDER — PANTOPRAZOLE SODIUM 40 MG PO TBEC: 40.0000 mg | DELAYED_RELEASE_TABLET | Freq: Every day | ORAL | 3 refills | Status: DC

## 2023-05-27 MED ORDER — HYDROCOD POLI-CHLORPHE POLI ER 10-8 MG/5ML PO SUER: 5.0000 mL | Freq: Two times a day (BID) | ORAL | 0 refills | Status: AC | PRN

## 2023-05-27 NOTE — Progress Notes (Signed)
 Subjective:    Patient ID: Cody [name], male    DOB: [birth year], 73 y.o.   MRN: [id number]  Cough    Patient states that he has been coughing now for more than 2 months.  I saw him in February.  At that point he had already been coughing for 3 weeks.  I treated him symptomatically with Hycodan.  He states that the cough continues.  He denies any fevers or chills.  He denies any shortness of breath or pleurisy.  He denies any heartburn.  He denies any sinus pain.  He denies any postnasal drip or rhinorrhea.  Instead he has a persistent cough on a daily basis.  There is no exacerbating or alleviating factors.  He denies any sudden weight loss.  He denies any night sweats. Past Medical History:  Diagnosis Date   Acute URI 11/08/2012   Allergy    Bradycardia    CVA (cerebral infarction)    Leukopenia    Migraine headache    Prediabetes    Statin intolerance 03/08/2018   Past Surgical History:  Procedure Laterality Date   SHOULDER SURGERY     right   Current Outpatient Medications on File Prior to Visit  Medication Sig Dispense Refill   albuterol (VENTOLIN HFA) 108 (90 Base) MCG/ACT inhaler Inhale 2 puffs into the lungs every 6 (six) hours as needed for wheezing or shortness of breath. 8 g 0   aspirin 81 MG EC tablet Take by mouth.     benzonatate (TESSALON) 200 MG capsule Take 1 capsule (200 mg total) by mouth 3 (three) times daily as needed for cough. (Patient not taking: Reported on 05/27/2023) 30 capsule 0   fluticasone (FLONASE) 50 MCG/ACT nasal spray USE 2 SPRAYS IN EACH NOSTRIL EVERY DAY 48 g 3   HYDROcodone bit-homatropine (HYCODAN) 5-1.5 MG/5ML syrup Take 5 mLs by mouth every 8 (eight) hours as needed for cough. 120 mL 0   losartan-hydrochlorothiazide (HYZAAR) 50-12.5 MG tablet TAKE 1 TABLET EVERY DAY IN THE MORNING 90 tablet 3   pravastatin (PRAVACHOL) 10 MG tablet TAKE 1 TABLET EVERY DAY 90 tablet 1   Current Facility-Administered Medications on File Prior to Visit   Medication Dose Route Frequency Provider Last Rate Last Admin   Hyaluronan (MONOVISC) intra-articular injection 88 mg  88 mg Intra-articular Once Overturf, Jackson L, PA-C       Allergies  Allergen Reactions   Codeine     Generalized itching    Statins    Social History   Socioeconomic History   Marital status: Married    Spouse name: Not on file   Number of children: Not on file   Years of education: Not on file   Highest education level: Not on file  Occupational History   Not on file  Tobacco Use   Smoking status: Never   Smokeless tobacco: Never  Vaping Use   Vaping status: Never Used  Substance and Sexual Activity   Alcohol use: No   Drug use: No   Sexual activity: Yes    Birth control/protection: None  Other Topics Concern   Not on file  Social History Narrative   Not on file   Social Drivers of Health   Financial Resource Strain: Low Risk  (11/12/2022)   Overall Financial Resource Strain (CARDIA)    Difficulty of Paying Living Expenses: Not hard at all  Food Insecurity: No Food Insecurity (11/12/2022)   Hunger Vital Sign    Worried About Running Out  of Food in the Last Year: Never true    Ran Out of Food in the Last Year: Never true  Transportation Needs: No Transportation Needs (11/12/2022)   PRAPARE - Administrator, Civil Service (Medical): No    Lack of Transportation (Non-Medical): No  Physical Activity: Sufficiently Active (11/12/2022)   Exercise Vital Sign    Days of Exercise per Week: 5 days    Minutes of Exercise per Session: 30 min  Stress: No Stress Concern Present (11/12/2022)   [employer] of Occupational Health - Occupational Stress Questionnaire    Feeling of Stress : Not at all  Social Connections: Socially Integrated (11/12/2022)   Social Connection and Isolation Panel [NHANES]    Frequency of Communication with Friends and Family: More than three times a week    Frequency of Social Gatherings with Friends and Family:  Three times a week    Attends Religious Services: More than 4 times per year    Active Member of Clubs or Organizations: Yes    Attends Banker Meetings: 1 to 4 times per year    Marital Status: Married  Catering manager Violence: Not At Risk (11/12/2022)   Humiliation, Afraid, Rape, and Kick questionnaire    Fear of Current or Ex-Partner: No    Emotionally Abused: No    Physically Abused: No    Sexually Abused: No   Family History  Problem Relation Age of Onset   Hypertension Mother    Hyperlipidemia Mother    Diabetes Mother       Review of Systems  Respiratory:  Positive for cough.   All other systems reviewed and are negative.      Objective:   Physical Exam Vitals reviewed.  Constitutional:      General: He is not in acute distress.    Appearance: He is well-developed. He is not diaphoretic.  HENT:     Head: Normocephalic and atraumatic.     Right Ear: Tympanic membrane, ear canal and external ear normal.     Left Ear: Tympanic membrane, ear canal and external ear normal.     Nose: Nose normal. No congestion or rhinorrhea.     Mouth/Throat:     Pharynx: No oropharyngeal exudate.  Eyes:     General: No scleral icterus.       Right eye: No discharge.        Left eye: No discharge.     Conjunctiva/sclera: Conjunctivae normal.     Pupils: Pupils are equal, round, and reactive to light.  Neck:     Thyroid: No thyromegaly.     Vascular: No JVD.     Trachea: No tracheal deviation.  Cardiovascular:     Rate and Rhythm: Normal rate and regular rhythm.     Heart sounds: Murmur heard.     No friction rub. No gallop.  Pulmonary:     Effort: Pulmonary effort is normal. No respiratory distress.     Breath sounds: Normal breath sounds. No stridor. No wheezing or rales.  Chest:     Chest wall: No tenderness.  Abdominal:     General: Bowel sounds are normal. There is no distension.     Palpations: Abdomen is soft. There is no mass.     Tenderness: There  is no abdominal tenderness. There is no guarding or rebound.  Musculoskeletal:     Cervical back: Normal range of motion and neck supple.  Lymphadenopathy:     Cervical: No cervical adenopathy.  Skin:    General: Skin is warm.     Coloration: Skin is not pale.     Findings: No erythema or rash.  Neurological:     Mental Status: He is alert.     Motor: No abnormal muscle tone.     Deep Tendon Reflexes: Reflexes are normal and symmetric.  Psychiatric:        Thought Content: Thought content normal.        Judgment: Judgment normal.          Assessment & Plan:   Acute cough - Plan: DG Chest 2 View Patient has had a cough now for 2 months or more with no signs of any infection.  Check chest x-ray.  Suspect upper airway cough syndrome.  Recommended Protonix 40 mg a day Tussionex 1 teaspoon every 12 hours as needed for cough.  Await the results of the chest x-ray.

## 2023-05-28 ENCOUNTER — Ambulatory Visit
Admission: RE | Admit: 2023-05-28 | Discharge: 2023-05-28 | Disposition: A | Discharge status: Home / Self Care | Source: Ambulatory Visit | Attending: Family Medicine | Admitting: Family Medicine

## 2023-05-28 DIAGNOSIS — R051 Acute cough: Secondary | ICD-10-CM

## 2023-05-28 DIAGNOSIS — R059 Cough, unspecified: Secondary | ICD-10-CM | POA: Diagnosis not present

## 2023-06-05 ENCOUNTER — Other Ambulatory Visit: Payer: Self-pay | Admitting: Family Medicine

## 2023-06-07 NOTE — Telephone Encounter (Signed)
 Requested Prescriptions  Pending Prescriptions Disp Refills   pravastatin  (PRAVACHOL ) 10 MG tablet [Pharmacy Med Name: Pravastatin  Sodium Oral Tablet 10 MG] 90 tablet 3    Sig: TAKE 1 TABLET EVERY DAY     Cardiovascular:  Antilipid - Statins Failed - 06/07/2023  9:58 AM      Failed - Lipid Panel in normal range within the last 12 months    Cholesterol  Date Value Ref Range Status  07/09/2022 166 <200 mg/dL Final   LDL Cholesterol (Calc)  Date Value Ref Range Status  07/09/2022 66 mg/dL (calc) Final    Comment:    Reference range: <100 . Desirable range <100 mg/dL for primary prevention;   <70 mg/dL for patients with CHD or diabetic patients  with > or = 2 CHD risk factors. Aaron Aas LDL-C is now calculated using the Martin-Hopkins  calculation, which is a validated novel method providing  better accuracy than the Friedewald equation in the  estimation of LDL-C.  Melinda Sprawls et al. Erroll Heard. 1610;960(45): 2061-2068  (http://education.[facility].com/faq/FAQ164)    HDL  Date Value Ref Range Status  07/09/2022 86 > OR = 40 mg/dL Final   Triglycerides  Date Value Ref Range Status  07/09/2022 48 <150 mg/dL Final         Passed - Patient is not pregnant      Passed - Valid encounter within last 12 months    Recent Outpatient Visits           1 week ago Acute cough   [place] [facility] Pickard, Cisco Crest, MD   1 month ago Acute cough   [place] [facility] Austine Lefort, MD   3 months ago Viral URI with cough   [place] [facility] Jenelle Mis, FNP   11 months ago General medical exam   [place] [facility] Austine Lefort, MD   11 months ago Shortness of breath   [place] [facility] Pickard, Cisco Crest, MD

## 2023-06-17 ENCOUNTER — Other Ambulatory Visit: Payer: Self-pay | Admitting: Family Medicine

## 2023-06-17 DIAGNOSIS — I10 Essential (primary) hypertension: Secondary | ICD-10-CM

## 2023-06-17 NOTE — Telephone Encounter (Signed)
 Prescription Request  06/17/2023  LOV: 05/27/2023  What is the name of the medication or equipment? losartan -hydrochlorothiazide (HYZAAR) 50-12.5 MG tablet   Have you contacted your pharmacy to request a refill? Yes   Which pharmacy would you like this sent to?  [facility] - [place], [place] - [address] [place] [address] Phone: [phone] Fax: [phone]    Patient notified that their request is being sent to the clinical staff for review and that they should receive a response within 2 business days.   Please advise at [facility] [phone]

## 2023-06-21 ENCOUNTER — Encounter: Payer: Self-pay | Admitting: *Deleted

## 2023-06-21 MED ORDER — LOSARTAN POTASSIUM-HCTZ 50-12.5 MG PO TABS: ORAL_TABLET | ORAL | 0 refills | Status: DC

## 2023-06-21 NOTE — Telephone Encounter (Signed)
 Requested medication (s) are due for refill today:   Yes  Requested medication (s) are on the active medication list:   Yes  Future visit scheduled:   No     LOV 05/27/2023 for an acute issue.    Last CPE 07/09/2022    Sent a MyChart message to call and make an appt for yearly physical with Dr. Cheril Cork.   Last ordered: 07/09/2022 #90, 3 refills  Unable to refill because labs and CPE due.     Requested Prescriptions  Pending Prescriptions Disp Refills   losartan -hydrochlorothiazide (HYZAAR) 50-12.5 MG tablet 90 tablet 3    Sig: TAKE 1 TABLET EVERY DAY IN THE MORNING     Cardiovascular: ARB + Diuretic Combos Failed - 06/21/2023 11:45 AM      Failed - K in normal range and within 180 days    Potassium  Date Value Ref Range Status  07/09/2022 3.9 3.5 - 5.3 mmol/L Final         Failed - Na in normal range and within 180 days    Sodium  Date Value Ref Range Status  07/09/2022 141 135 - 146 mmol/L Final         Failed - Cr in normal range and within 180 days    Creat  Date Value Ref Range Status  07/09/2022 1.54 (H) 0.70 - 1.28 mg/dL Final         Failed - eGFR is 10 or above and within 180 days    GFR, Est African American  Date Value Ref Range Status  05/17/2020 72 > OR = 60 mL/min/1.85m2 Final   GFR, Est Non African American  Date Value Ref Range Status  05/17/2020 62 > OR = 60 mL/min/1.23m2 Final   eGFR  Date Value Ref Range Status  07/09/2022 48 (L) > OR = 60 mL/min/1.71m2 Final         Failed - Valid encounter within last 6 months    Recent Outpatient Visits           3 weeks ago Acute cough   [place] [facility] Austine Lefort, MD   2 months ago Acute cough   [place] [facility] Austine Lefort, MD   3 months ago Viral URI with cough   [place] [facility] Jenelle Mis, FNP   11 months ago General medical exam   [place] [facility] Austine Lefort, MD   11 months ago  Shortness of breath   [place] [facility] Pickard, Cisco Crest, MD              Passed - Patient is not pregnant      Passed - Last BP in normal range    BP Readings from Last 1 Encounters:  05/27/23 122/78

## 2023-06-25 ENCOUNTER — Other Ambulatory Visit: Payer: Self-pay | Admitting: Family Medicine

## 2023-06-25 DIAGNOSIS — I10 Essential (primary) hypertension: Secondary | ICD-10-CM

## 2023-08-03 DIAGNOSIS — M1611 Unilateral primary osteoarthritis, right hip: Secondary | ICD-10-CM | POA: Diagnosis not present

## 2023-08-03 DIAGNOSIS — M25551 Pain in right hip: Secondary | ICD-10-CM | POA: Diagnosis not present

## 2023-08-05 ENCOUNTER — Ambulatory Visit: Payer: Self-pay

## 2023-08-05 NOTE — Telephone Encounter (Signed)
 FYI Only or Action Required?: Action required by provider: requesting a different pain medication and possible appointment for tomorrow-patient will be out of town the beginning of next week. .  Patient was last seen in primary care on 05/27/2023 by Austine Lefort, MD. Called Nurse Triage reporting Hip Pain. Symptoms began 4 days ago. Interventions attempted: Prescription medications: diclofenac , Rest, hydration, or home remedies, and Ice/heat application. Symptoms are: unchanged.  Triage Disposition: See PCP When Office is Open (Within 3 Days)  Patient/caregiver understands and will follow disposition?: No, wishes to speak with PCP  Copied from CRM [id number]. Topic: Clinical - Red Word Triage >> Aug 05, 2023  3:56 PM Stanly Early wrote: Red Word that prompted transfer to Nurse Triage: patient is having pain in right hip/back and numbmess   ----------------------------------------------------------------------- From previous Reason for Contact - Scheduling: Patient/patient representative is calling to schedule an appointment. Refer to attachments for appointment information. Reason for Disposition  [1] MODERATE pain (e.g., interferes with normal activities, limping) AND [2] present > 3 days  Answer Assessment - Initial Assessment Questions 1. LOCATION and RADIATION: Where is the pain located?      Right hip pain 2. QUALITY: What does the pain feel like?  (e.g., sharp, dull, aching, burning)     aching 3. SEVERITY: How bad is the pain? What does it keep you from doing?   (Scale 1-10; or mild, moderate, severe)   -  MILD (1-3): doesn't interfere with normal activities    -  MODERATE (4-7): interferes with normal activities (e.g., work or school) or awakens from sleep, limping    -  SEVERE (8-10): excruciating pain, unable to do any normal activities, unable to walk     6 out of 10 4. ONSET: When did the pain start? Does it come and go, or is it there all the time?     Started on  Sunday 5. WORK OR EXERCISE: Has there been any recent work or exercise that involved this part of the body?      no 6. CAUSE: What do you think is causing the hip pain?      unsure 7. AGGRAVATING FACTORS: What makes the hip pain worse? (e.g., walking, climbing stairs, running)     Walking, staying in one position for too long.  8. OTHER SYMPTOMS: Do you have any other symptoms? (e.g., back pain, pain shooting down leg,  fever, rash)     Numbness,  Patient was seen at [facility] on 08/03/2023-x-rays done and patient given a prescription from oral diclofenac . Patient states this medication isn't helping and asking if anything else could be prescribed. Offered patient an appointment on Monday with Dr. Cheril Cork but states he will be out of town for the beginning of next week. Patient is asking for a phone call in regards to possible appointment and medication request for pain medication.  Protocols used: Hip Pain-A-AH

## 2023-08-19 ENCOUNTER — Other Ambulatory Visit: Payer: Self-pay | Admitting: Family Medicine

## 2023-08-21 ENCOUNTER — Other Ambulatory Visit: Payer: Self-pay | Admitting: Family Medicine

## 2023-09-22 ENCOUNTER — Other Ambulatory Visit: Payer: Self-pay | Admitting: Family Medicine

## 2023-09-22 DIAGNOSIS — I10 Essential (primary) hypertension: Secondary | ICD-10-CM

## 2023-10-19 ENCOUNTER — Encounter: Payer: Self-pay | Admitting: Family Medicine

## 2023-10-19 ENCOUNTER — Ambulatory Visit: Payer: Self-pay | Admitting: *Deleted

## 2023-10-19 ENCOUNTER — Ambulatory Visit (INDEPENDENT_AMBULATORY_CARE_PROVIDER_SITE_OTHER): Admitting: Family Medicine

## 2023-10-19 VITALS — BP 140/85 | HR 67 | Temp 97.7°F | Ht 68.0 in

## 2023-10-19 DIAGNOSIS — B309 Viral conjunctivitis, unspecified: Secondary | ICD-10-CM | POA: Insufficient documentation

## 2023-10-19 MED ORDER — NAPHAZOLINE-PHENIRAMINE 0.025-0.3 % OP SOLN
1.0000 [drp] | Freq: Four times a day (QID) | OPHTHALMIC | 0 refills | Status: AC | PRN
Start: 2023-10-19 — End: ?

## 2023-10-19 NOTE — Assessment & Plan Note (Signed)
 Symptoms most consistent with viral conjunctivitis. Start Naphcon-A QID PRN. Symptomatic management with cool or warm compress and lubricating drops. Keep hands washed and avoid rubbing. Follow up PRN if symptoms persist or worsen.

## 2023-10-19 NOTE — Telephone Encounter (Signed)
 FYI Only or Action Required?: FYI only for provider.  Patient was last seen in primary care on 05/27/2023 by Cody [name] DASEN, MD.  Called Nurse Triage reporting Eye Drainage.  Symptoms began several days ago.  Interventions attempted: Rest, hydration, or home remedies.  Symptoms are: gradually worsening.  Triage Disposition: See Physician Within 24 Hours  Patient/caregiver understands and will follow disposition?: Yes            Copied from CRM [id number]. Topic: Clinical - Red Word Triage >> Oct 19, 2023  8:53 AM [name] wrote: Red Word that prompted transfer to Nurse Triage: VISION Patient experiencing left eye pain, swelling, redness, drainage Answer Assessment - Initial Assessment Questions 1. EYE DISCHARGE: Is the discharge in one or both eyes? What color is it? How much is there? When did the discharge start?      Left eye redness, Sunday  2. REDNESS OF SCLERA: Is there redness in the white of the eye? If Yes, ask: Is it in one or both eyes? When did the redness start?     *No Answer* 3. EYELIDS: Are the eyelids red or swollen? If Yes, ask: How much?      Yes  4. VISION: Do you have blurred vision?     Blurred at times clear now  5. PAIN: Is there any pain? If Yes, ask: How bad is the pain? (Scale 0-10; or none, mild, moderate, severe)     Mild  6. CONTACT LENS: Do you wear contacts?     na 7. OTHER SYMPTOMS: Do you have any other symptoms? (e.g., fever, runny nose, cough)     Sneezing, left eye drainage clear  8. PREGNANCY: Is there any chance you are pregnant? When was your last menstrual period?     na  Protocols used: Eye - Pus or Discharge-A-AH  Reason for Disposition  Eyelid is red and painful (or tender to touch)  Answer Assessment - Initial Assessment Questions 1. EYE DISCHARGE: Is the discharge in one or both eyes? What color is it? How much is there? When did the discharge start?      Left eye redness, Sunday   2. REDNESS OF SCLERA: Is there redness in the white of the eye? If Yes, ask: Is it in one or both eyes? When did the redness start?     *No Answer* 3. EYELIDS: Are the eyelids red or swollen? If Yes, ask: How much?      Yes  4. VISION: Do you have blurred vision?     Blurred at times clear now  5. PAIN: Is there any pain? If Yes, ask: How bad is the pain? (Scale 0-10; or none, mild, moderate, severe)     Mild  6. CONTACT LENS: Do you wear contacts?     na 7. OTHER SYMPTOMS: Do you have any other symptoms? (e.g., fever, runny nose, cough)     Sneezing, left eye drainage clear  8. PREGNANCY: Is there any chance you are pregnant? When was your last menstrual period?     na  Protocols used: Eye - Pus or Discharge-A-AH  Answer Assessment - Initial Assessment Questions 1. EYE DISCHARGE: Is the discharge in one or both eyes? What color is it? How much is there? When did the discharge start?      Left eye redness, Sunday  2. REDNESS OF SCLERA: Is there redness in the white of the eye? If Yes, ask: Is it in one or both eyes? When did  the redness start?     *No Answer* 3. EYELIDS: Are the eyelids red or swollen? If Yes, ask: How much?      Yes  4. VISION: Do you have blurred vision?     Blurred at times clear now  5. PAIN: Is there any pain? If Yes, ask: How bad is the pain? (Scale 0-10; or none, mild, moderate, severe)     Mild  6. CONTACT LENS: Do you wear contacts?     na 7. OTHER SYMPTOMS: Do you have any other symptoms? (e.g., fever, runny nose, cough)     Sneezing, left eye drainage clear  8. PREGNANCY: Is there any chance you are pregnant? When was your last menstrual period?     na  Protocols used: Eye - Pus or Discharge-A-AH  Reason for Disposition  Eyelid is red and painful (or tender to touch)  Answer Assessment - Initial Assessment Questions 1. EYE DISCHARGE: Is the discharge in one or both eyes? What color is  it? How much is there? When did the discharge start?      Left eye redness, Sunday  2. REDNESS OF SCLERA: Is there redness in the white of the eye? If Yes, ask: Is it in one or both eyes? When did the redness start?     *No Answer* 3. EYELIDS: Are the eyelids red or swollen? If Yes, ask: How much?      Yes  4. VISION: Do you have blurred vision?     Blurred at times clear now  5. PAIN: Is there any pain? If Yes, ask: How bad is the pain? (Scale 0-10; or none, mild, moderate, severe)     Mild  6. CONTACT LENS: Do you wear contacts?     na 7. OTHER SYMPTOMS: Do you have any other symptoms? (e.g., fever, runny nose, cough)     Sneezing, left eye drainage clear  8. PREGNANCY: Is there any chance you are pregnant? When was your last menstrual period?     na  Protocols used: Eye - Pus or Discharge-A-AH  Reason for Disposition  Eyelid is red and painful (or tender to touch)  Answer Assessment - Initial Assessment Questions Appt scheduled today with other provider. None available with PCP until Thursday. Due to pain, scheduled with other provider.      1. EYE DISCHARGE: Is the discharge in one or both eyes? What color is it? How much is there? When did the discharge start?      Left eye redness, sx started Sunday  2. REDNESS OF SCLERA: Is there redness in the white of the eye? If Yes, ask: Is it in one or both eyes? When did the redness start?     Yes feels like something scratchy  3. EYELIDS: Are the eyelids red or swollen? If Yes, ask: How much?      Yes  4. VISION: Do you have blurred vision?     Blurred at times clear now  5. PAIN: Is there any pain? If Yes, ask: How bad is the pain? (Scale 0-10; or none, mild, moderate, severe)     Mild  6. CONTACT LENS: Do you wear contacts?     na 7. OTHER SYMPTOMS: Do you have any other symptoms? (e.g., fever, runny nose, cough)     Sneezing, left eye drainage clear , swelling  redness 8. PREGNANCY: Is there any chance you are pregnant? When was your last menstrual period?     na  Protocols used:  Eye - Pus or Discharge-A-AH

## 2023-10-19 NOTE — Progress Notes (Signed)
 Subjective:  HPI: Cody [name] is a 73 y.o. male presenting on 10/19/2023 for Acute Visit (Left eye drainage, and swelling since Sunday )   HPI Patient is in today for left eye redness, swelling, and clear drainage for 2 days. Associated symptoms include sneezing, itching eye, gritty sensation Denies fever, chills, body aches, purulent drainage, eye pain, vision loss, sick exposures, foreign body. Has tried Pataday with minimal relief. No history of similar symptoms.   Review of Systems  All other systems reviewed and are negative.   Relevant past medical history reviewed and updated as indicated.   Past Medical History:  Diagnosis Date   Acute URI 11/08/2012   Allergy    Bradycardia    CVA (cerebral infarction)    Leukopenia    Migraine headache    Prediabetes    Statin intolerance 03/08/2018     Past Surgical History:  Procedure Laterality Date   SHOULDER SURGERY     right    Allergies and medications reviewed and updated.   Current Outpatient Medications:    albuterol  (VENTOLIN  HFA) 108 (90 Base) MCG/ACT inhaler, Inhale 2 puffs into the lungs every 6 (six) hours as needed for wheezing or shortness of breath., Disp: 8 g, Rfl: 0   aspirin 81 MG EC tablet, Take by mouth., Disp: , Rfl:    benzonatate  (TESSALON ) 200 MG capsule, Take 1 capsule (200 mg total) by mouth 3 (three) times daily as needed for cough. (Patient not taking: Reported on 10/19/2023), Disp: 30 capsule, Rfl: 0   fluticasone  (FLONASE ) 50 MCG/ACT nasal spray, USE 2 SPRAYS IN EACH NOSTRIL EVERY DAY (Patient taking differently: Place 2 sprays into both nostrils as needed for allergies or rhinitis. USE 2 SPRAYS IN EACH NOSTRIL EVERY DAY), Disp: 48 g, Rfl: 3   losartan -hydrochlorothiazide (HYZAAR) 50-12.5 MG tablet, TAKE 1 TABLET EVERY MORNING, Disp: 90 tablet, Rfl: 3   naphazoline-pheniramine (NAPHCON-A) 0.025-0.3 % ophthalmic solution, Place 1 drop into the left eye 4 (four) times daily as needed for eye  irritation., Disp: 15 mL, Rfl: 0   pravastatin  (PRAVACHOL ) 10 MG tablet, TAKE 1 TABLET EVERY DAY, Disp: 90 tablet, Rfl: 3   chlorpheniramine-HYDROcodone  (TUSSIONEX) 10-8 MG/5ML, Take 5 mLs by mouth every 12 (twelve) hours as needed. (Patient not taking: Reported on 10/19/2023), Disp: 120 mL, Rfl: 0   HYDROcodone  bit-homatropine (HYCODAN) 5-1.5 MG/5ML syrup, Take 5 mLs by mouth every 8 (eight) hours as needed for cough. (Patient not taking: Reported on 10/19/2023), Disp: 120 mL, Rfl: 0   pantoprazole  (PROTONIX ) 40 MG tablet, TAKE 1 TABLET BY MOUTH EVERY DAY (Patient not taking: Reported on 10/19/2023), Disp: 90 tablet, Rfl: 1  Current Facility-Administered Medications:    Hyaluronan (MONOVISC) intra-articular injection 88 mg, 88 mg, Intra-articular, Once, Overturf, Jackson L, PA-C  Allergies  Allergen Reactions   Codeine     Generalized itching    Statins     Objective:   BP (!) 140/85   Pulse 67   Temp 97.7 F (36.5 C)   Ht 5' 8 (1.727 m)   SpO2 98%   BMI 27.49 kg/m      10/19/2023   10:17 AM 10/19/2023   10:07 AM 05/27/2023    3:36 PM  Vitals with BMI  Height  5' 8 5' 8  Weight   180 lbs 13 oz  BMI   27.5  Systolic 140 142 877  Diastolic 85 88 78  Pulse  67 76     Physical Exam Vitals and  nursing note reviewed.  Constitutional:      Appearance: Normal appearance. He is normal weight.  HENT:     Head: Normocephalic and atraumatic.  Eyes:     General: Lids are normal. Lids are everted, no foreign bodies appreciated. No visual field deficit.    Extraocular Movements: Extraocular movements intact.     Conjunctiva/sclera:     Left eye: Left conjunctiva is injected. No exudate.    Pupils: Pupils are equal, round, and reactive to light.  Skin:    General: Skin is warm and dry.     Capillary Refill: Capillary refill takes less than 2 seconds.  Neurological:     General: No focal deficit present.     Mental Status: He is alert and oriented to person, place, and time. Mental  status is at baseline.  Psychiatric:        Mood and Affect: Mood normal.        Behavior: Behavior normal.        Thought Content: Thought content normal.        Judgment: Judgment normal.     Assessment & Plan:  Viral conjunctivitis of left eye Assessment & Plan: Symptoms most consistent with viral conjunctivitis. Start Naphcon-A QID PRN. Symptomatic management with cool or warm compress and lubricating drops. Keep hands washed and avoid rubbing. Follow up PRN if symptoms persist or worsen.   Other orders -     Naphazoline-Pheniramine; Place 1 drop into the left eye 4 (four) times daily as needed for eye irritation.  Dispense: 15 mL; Refill: 0     Follow up plan: Return if symptoms worsen or fail to improve.  Jeoffrey GORMAN Barrio, FNP

## 2023-12-02 ENCOUNTER — Encounter: Payer: Self-pay | Admitting: Podiatry

## 2023-12-02 ENCOUNTER — Ambulatory Visit: Admitting: Podiatry

## 2023-12-02 VITALS — Ht 68.0 in | Wt 180.8 lb

## 2023-12-02 DIAGNOSIS — B351 Tinea unguium: Secondary | ICD-10-CM

## 2023-12-02 DIAGNOSIS — M79675 Pain in left toe(s): Secondary | ICD-10-CM

## 2023-12-02 DIAGNOSIS — M79674 Pain in right toe(s): Secondary | ICD-10-CM | POA: Diagnosis not present

## 2023-12-03 NOTE — Progress Notes (Signed)
 Subjective:   Patient ID: Cody [name], male   DOB: 73 y.o.   MRN: [id number]   HPI patient presents stating concerned about nail disease with thickness of the big toenails bilateral with patient found to have elongation of the beds difficulty for him to cut and it is been going on for a number years.  Patient is not currently smoking and does try to be active     Review of Systems  All other systems reviewed and are negative.       Objective:  Physical Exam Vitals and nursing note reviewed.  Constitutional:      Appearance: He is well-developed.  Pulmonary:     Effort: Pulmonary effort is normal.  Musculoskeletal:        General: Normal range of motion.  Skin:    General: Skin is warm.  Neurological:     Mental Status: He is alert.     Neurovascular status was found to be mildly diminished but intact with the patient noted to have adequate range of motion and diminished muscle strength slight.  Patient is found to have significant nail disease 1-5 both feet with thick yellow brittle nailbeds that get painful and make it hard for him to be comfortable and to wear shoe gear with any degree of comfort.  Patient cannot cut these himself has good digital perfusion well-oriented     Assessment:  Chronic mycotic nail infection with pain 1-5 both feet with trauma also present     Plan:  H&P reviewed I do not recommend oral medicines I do not recommend topical and today I did do debridement of the nailbeds with no iatrogenic bleeding and I have recommended this be done on a periodic basis or to get pedicures

## 2023-12-20 DIAGNOSIS — H524 Presbyopia: Secondary | ICD-10-CM | POA: Diagnosis not present

## 2024-01-07 ENCOUNTER — Encounter: Admitting: Family Medicine

## 2024-01-17 DIAGNOSIS — H401131 Primary open-angle glaucoma, bilateral, mild stage: Secondary | ICD-10-CM | POA: Diagnosis not present

## 2024-01-17 DIAGNOSIS — I10 Essential (primary) hypertension: Secondary | ICD-10-CM | POA: Diagnosis not present

## 2024-01-17 DIAGNOSIS — H2513 Age-related nuclear cataract, bilateral: Secondary | ICD-10-CM | POA: Diagnosis not present

## 2024-01-31 ENCOUNTER — Ambulatory Visit: Admitting: Family Medicine

## 2024-01-31 ENCOUNTER — Encounter: Payer: Self-pay | Admitting: Family Medicine

## 2024-01-31 VITALS — BP 116/82 | HR 64 | Temp 97.9°F | Ht 68.0 in | Wt 178.4 lb

## 2024-01-31 DIAGNOSIS — K2990 Gastroduodenitis, unspecified, without bleeding: Secondary | ICD-10-CM | POA: Insufficient documentation

## 2024-01-31 DIAGNOSIS — Z1211 Encounter for screening for malignant neoplasm of colon: Secondary | ICD-10-CM | POA: Insufficient documentation

## 2024-01-31 DIAGNOSIS — E785 Hyperlipidemia, unspecified: Secondary | ICD-10-CM | POA: Diagnosis not present

## 2024-01-31 DIAGNOSIS — Z125 Encounter for screening for malignant neoplasm of prostate: Secondary | ICD-10-CM | POA: Diagnosis not present

## 2024-01-31 DIAGNOSIS — D649 Anemia, unspecified: Secondary | ICD-10-CM | POA: Insufficient documentation

## 2024-01-31 DIAGNOSIS — Z8673 Personal history of transient ischemic attack (TIA), and cerebral infarction without residual deficits: Secondary | ICD-10-CM

## 2024-01-31 DIAGNOSIS — Z0000 Encounter for general adult medical examination without abnormal findings: Secondary | ICD-10-CM | POA: Diagnosis not present

## 2024-01-31 DIAGNOSIS — I10 Essential (primary) hypertension: Secondary | ICD-10-CM

## 2024-01-31 DIAGNOSIS — D509 Iron deficiency anemia, unspecified: Secondary | ICD-10-CM | POA: Insufficient documentation

## 2024-01-31 DIAGNOSIS — R7303 Prediabetes: Secondary | ICD-10-CM | POA: Insufficient documentation

## 2024-01-31 NOTE — Progress Notes (Signed)
 Subjective:    Patient ID: Cody [name], male    DOB: 10-22-1950, 73 y.o.   MRN: [id number]  HPI  Patient is a very pleasant 73 year old African-American gentleman here today for complete physical exam.  He has a remote history of a right periventricular caudate infarct on MRI in 2016.  This was found as part of a work-up for memory loss.  Patient had a colonoscopy earlier this year that was negative.  He does not require any other colonoscopies in the future unless he develops symptoms.  He is due for prostate cancer screening.  He denies any chest pain or shortness of breath or dyspnea on exertion.  He denies any change in his memory.  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  He is still driving and managing his finances.  He denies any depression or falls. Immunization History  Administered Date(s) Administered   Fluad Quad(high Dose 65+) 10/26/2018, 11/05/2021   INFLUENZA, HIGH DOSE SEASONAL PF 11/11/2017, 12/08/2019, 11/06/2020   Influenza,inj,Quad PF,6+ Mos 11/29/2016   Influenza-Unspecified 11/16/2015, 10/26/2018, 11/03/2022   PFIZER(Purple Top)SARS-COV-2 Vaccination 04/19/2019, 05/10/2019, 12/08/2019, 06/10/2020   Pfizer Covid-19 Vaccine Bivalent Booster 50yrs & up 11/06/2020   Pfizer(Comirnaty)Fall Seasonal Vaccine 12 years and older 11/03/2022, 12/14/2023   Pneumococcal Conjugate-13 02/25/2018   Pneumococcal Polysaccharide-23 03/06/2019   Td 05/18/2006   Tdap 06/07/2006, 06/12/2016   Zoster Recombinant(Shingrix) 06/04/2020, 08/15/2020   Zoster, Live 07/29/2012   Patient recently had blood work.  This was nonfasting.  Lab work was significant for mild anemia, normal testosterone  level, normal thyroid test, blood sugar slightly elevated but this was nonfasting.  Otherwise CMP was normal No visits with results within 1 Month(s) from this visit.  Latest known visit with results is:  Scanned Document on 03/09/2023  Component Date Value Ref Range Status    HM Colonoscopy 03/05/2023 See Report (in chart)  See Report (in chart), Patient Reported Final   ABSTRACTED BY HIM    Past Medical History:  Diagnosis Date   Acute URI 11/08/2012   Allergy    Bradycardia    CVA (cerebral infarction)    Leukopenia    Migraine headache    Prediabetes    Statin intolerance 03/08/2018   Past Surgical History:  Procedure Laterality Date   SHOULDER SURGERY     right   Current Outpatient Medications on File Prior to Visit  Medication Sig Dispense Refill   albuterol  (VENTOLIN  HFA) 108 (90 Base) MCG/ACT inhaler Inhale 2 puffs into the lungs every 6 (six) hours as needed for wheezing or shortness of breath. 8 g 0   aspirin 81 MG EC tablet Take by mouth.     benzonatate  (TESSALON ) 200 MG capsule Take 1 capsule (200 mg total) by mouth 3 (three) times daily as needed for cough. 30 capsule 0   chlorpheniramine-HYDROcodone  (TUSSIONEX) 10-8 MG/5ML Take 5 mLs by mouth every 12 (twelve) hours as needed. 120 mL 0   fluticasone  (FLONASE ) 50 MCG/ACT nasal spray USE 2 SPRAYS IN EACH NOSTRIL EVERY DAY (Patient taking differently: Place 2 sprays into both nostrils as needed for allergies or rhinitis. USE 2 SPRAYS IN EACH NOSTRIL EVERY DAY) 48 g 3   HYDROcodone  bit-homatropine (HYCODAN) 5-1.5 MG/5ML syrup Take 5 mLs by mouth every 8 (eight) hours as needed for cough. 120 mL 0   losartan -hydrochlorothiazide (HYZAAR) 50-12.5 MG tablet TAKE 1 TABLET EVERY MORNING 90 tablet 3   naphazoline-pheniramine (NAPHCON-A) 0.025-0.3 % ophthalmic solution Place 1 drop into the  left eye 4 (four) times daily as needed for eye irritation. 15 mL 0   pantoprazole  (PROTONIX ) 40 MG tablet TAKE 1 TABLET BY MOUTH EVERY DAY 90 tablet 1   pravastatin  (PRAVACHOL ) 10 MG tablet TAKE 1 TABLET EVERY DAY 90 tablet 3   Current Facility-Administered Medications on File Prior to Visit  Medication Dose Route Frequency Provider Last Rate Last Admin   Hyaluronan (MONOVISC) intra-articular injection 88 mg  88 mg  Intra-articular Once Overturf, Jackson L, PA-C       Allergies  Allergen Reactions   Codeine     Generalized itching    Statins    Social History   Socioeconomic History   Marital status: Married    Spouse name: Not on file   Number of children: Not on file   Years of education: Not on file   Highest education level: Not on file  Occupational History   Not on file  Tobacco Use   Smoking status: Never   Smokeless tobacco: Never  Vaping Use   Vaping status: Never Used  Substance and Sexual Activity   Alcohol use: No   Drug use: No   Sexual activity: Yes    Birth control/protection: None  Other Topics Concern   Not on file  Social History Narrative   Not on file   Social Drivers of Health   Tobacco Use: Low Risk (01/31/2024)   Patient History    Smoking Tobacco Use: Never    Smokeless Tobacco Use: Never    Passive Exposure: Not on file  Financial Resource Strain: Low Risk (11/12/2022)   Overall Financial Resource Strain (CARDIA)    Difficulty of Paying Living Expenses: Not hard at all  Food Insecurity: No Food Insecurity (11/12/2022)   Hunger Vital Sign    Worried About Running Out of Food in the Last Year: Never true    Ran Out of Food in the Last Year: Never true  Transportation Needs: No Transportation Needs (11/12/2022)   PRAPARE - Administrator, Civil Service (Medical): No    Lack of Transportation (Non-Medical): No  Physical Activity: Sufficiently Active (11/12/2022)   Exercise Vital Sign    Days of Exercise per Week: 5 days    Minutes of Exercise per Session: 30 min  Stress: No Stress Concern Present (11/12/2022)   [employer] of Occupational Health - Occupational Stress Questionnaire    Feeling of Stress : Not at all  Social Connections: Socially Integrated (11/12/2022)   Social Connection and Isolation Panel    Frequency of Communication with Friends and Family: More than three times a week    Frequency of Social Gatherings with Friends  and Family: Three times a week    Attends Religious Services: More than 4 times per year    Active Member of Clubs or Organizations: Yes    Attends Banker Meetings: 1 to 4 times per year    Marital Status: Married  Catering Manager Violence: Not At Risk (11/12/2022)   Humiliation, Afraid, Rape, and Kick questionnaire    Fear of Current or Ex-Partner: No    Emotionally Abused: No    Physically Abused: No    Sexually Abused: No  Depression (PHQ2-9): Low Risk (01/31/2024)   Depression (PHQ2-9)    PHQ-2 Score: 0  Alcohol Screen: Low Risk (11/12/2022)   Alcohol Screen    Last Alcohol Screening Score (AUDIT): 0  Housing: Low Risk (11/12/2022)   Housing    Last Housing Risk Score: 0  Utilities: Not At Risk (11/12/2022)   AHC Utilities    Threatened with loss of utilities: No  Health Literacy: Adequate Health Literacy (11/12/2022)   B1300 Health Literacy    Frequency of need for help with medical instructions: Never   Family History  Problem Relation Age of Onset   Hypertension Mother    Hyperlipidemia Mother    Diabetes Mother       Review of Systems  All other systems reviewed and are negative.      Objective:   Physical Exam Vitals reviewed.  Constitutional:      General: He is not in acute distress.    Appearance: He is well-developed. He is not diaphoretic.  HENT:     Head: Normocephalic and atraumatic.     Right Ear: External ear normal.     Left Ear: External ear normal.     Nose: Nose normal.     Mouth/Throat:     Pharynx: No oropharyngeal exudate.  Eyes:     General: No scleral icterus.       Right eye: No discharge.        Left eye: No discharge.     Conjunctiva/sclera: Conjunctivae normal.     Pupils: Pupils are equal, round, and reactive to light.  Neck:     Thyroid: No thyromegaly.     Vascular: No JVD.     Trachea: No tracheal deviation.  Cardiovascular:     Rate and Rhythm: Normal rate and regular rhythm.     Heart sounds: Murmur  heard.     No friction rub. No gallop.  Pulmonary:     Effort: Pulmonary effort is normal. No respiratory distress.     Breath sounds: Normal breath sounds. No stridor. No wheezing or rales.  Chest:     Chest wall: No tenderness.  Abdominal:     General: Bowel sounds are normal. There is no distension.     Palpations: Abdomen is soft. There is no mass.     Tenderness: There is no abdominal tenderness. There is no guarding or rebound.  Musculoskeletal:        General: No tenderness or deformity. Normal range of motion.     Cervical back: Normal range of motion and neck supple.  Lymphadenopathy:     Cervical: No cervical adenopathy.  Skin:    General: Skin is warm.     Coloration: Skin is not pale.     Findings: No erythema or rash.  Neurological:     Mental Status: He is alert and oriented to person, place, and time.     Cranial Nerves: No cranial nerve deficit.     Motor: No abnormal muscle tone.     Coordination: Coordination normal.     Deep Tendon Reflexes: Reflexes are normal and symmetric.  Psychiatric:        Behavior: Behavior normal.        Thought Content: Thought content normal.        Judgment: Judgment normal.          Assessment & Plan:   Prostate cancer screening - Plan: PSA  H/O: CVA (cerebrovascular accident) - Plan: CBC with Differential/Platelet, Comprehensive metabolic panel with GFR, Lipid panel  Hyperlipidemia, unspecified hyperlipidemia type - Plan: CBC with Differential/Platelet, Comprehensive metabolic panel with GFR, Lipid panel  General medical exam  Essential hypertension His blood pressure today is outstanding.  I will screen for prostate cancer with a PSA.  His colonoscopy is up-to-date.  I will  check his cholesterol.  Given his history of a CVA I would like to keep his LDL cholesterol less than 70.  He is taking aspirin as an antiplatelet agent.  I will also check a CBC and a CMP as well.  Immunizations are up-to-date as shown below.  The  patient had his flu shot at a local pharmacy Immunization History  Administered Date(s) Administered   Fluad Quad(high Dose 65+) 10/26/2018, 11/05/2021   INFLUENZA, HIGH DOSE SEASONAL PF 11/11/2017, 12/08/2019, 11/06/2020   Influenza,inj,Quad PF,6+ Mos 11/29/2016   Influenza-Unspecified 11/16/2015, 10/26/2018, 11/03/2022   PFIZER(Purple Top)SARS-COV-2 Vaccination 04/19/2019, 05/10/2019, 12/08/2019, 06/10/2020   Pfizer Covid-19 Vaccine Bivalent Booster 45yrs & up 11/06/2020   Pfizer(Comirnaty)Fall Seasonal Vaccine 12 years and older 11/03/2022, 12/14/2023   Pneumococcal Conjugate-13 02/25/2018   Pneumococcal Polysaccharide-23 03/06/2019   Td 05/18/2006   Tdap 06/07/2006, 06/12/2016   Zoster Recombinant(Shingrix) 06/04/2020, 08/15/2020   Zoster, Live 07/29/2012

## 2024-02-01 ENCOUNTER — Ambulatory Visit: Payer: Self-pay | Admitting: Family Medicine

## 2024-02-01 LAB — CBC WITH DIFFERENTIAL/PLATELET
Absolute Lymphocytes: 1921 {cells}/uL (ref 850–3900)
Absolute Monocytes: 422 {cells}/uL (ref 200–950)
Basophils Absolute: 40 {cells}/uL (ref 0–200)
Basophils Relative: 1.2 %
Eosinophils Absolute: 241 {cells}/uL (ref 15–500)
Eosinophils Relative: 7.3 %
HCT: 40.3 % (ref 39.4–51.1)
Hemoglobin: 12.7 g/dL — ABNORMAL LOW (ref 13.2–17.1)
MCH: 30 pg (ref 27.0–33.0)
MCHC: 31.5 g/dL — ABNORMAL LOW (ref 31.6–35.4)
MCV: 95 fL (ref 81.4–101.7)
MPV: 11.5 fL (ref 7.5–12.5)
Monocytes Relative: 12.8 %
Neutro Abs: 677 {cells}/uL — ABNORMAL LOW (ref 1500–7800)
Neutrophils Relative %: 20.5 %
Platelets: 187 Thousand/uL (ref 140–400)
RBC: 4.24 Million/uL (ref 4.20–5.80)
RDW: 12.2 % (ref 11.0–15.0)
Total Lymphocyte: 58.2 %
WBC: 3.3 Thousand/uL — ABNORMAL LOW (ref 3.8–10.8)

## 2024-02-01 LAB — PSA: PSA: 1.38 ng/mL (ref <=4.00)

## 2024-02-01 LAB — COMPREHENSIVE METABOLIC PANEL WITH GFR
AG Ratio: 1.5 (calc) (ref 1.0–2.5)
ALT: 8 U/L — ABNORMAL LOW (ref 9–46)
AST: 15 U/L (ref 10–35)
Albumin: 4.3 g/dL (ref 3.6–5.1)
Alkaline phosphatase (APISO): 70 U/L (ref 35–144)
BUN: 21 mg/dL (ref 7–25)
CO2: 27 mmol/L (ref 20–32)
Calcium: 9.6 mg/dL (ref 8.6–10.3)
Chloride: 104 mmol/L (ref 98–110)
Creat: 1.16 mg/dL (ref 0.70–1.28)
Globulin: 2.9 g/dL (ref 1.9–3.7)
Glucose, Bld: 97 mg/dL (ref 65–99)
Potassium: 4.5 mmol/L (ref 3.5–5.3)
Sodium: 141 mmol/L (ref 135–146)
Total Bilirubin: 0.8 mg/dL (ref 0.2–1.2)
Total Protein: 7.2 g/dL (ref 6.1–8.1)
eGFR: 67 mL/min/1.73m2 (ref >=60)

## 2024-02-01 LAB — LIPID PANEL
Cholesterol: 134 mg/dL (ref <200)
HDL: 68 mg/dL (ref >=40)
LDL Cholesterol (Calc): 51 mg/dL
Non-HDL Cholesterol (Calc): 66 mg/dL (ref <130)
Total CHOL/HDL Ratio: 2 (calc) (ref <5.0)
Triglycerides: 66 mg/dL (ref <150)
